# Patient Record
Sex: Female | Born: 1960 | Race: White | Hispanic: No | Marital: Married | Smoking: Former smoker
Health system: Southern US, Community
[De-identification: ages and names within clinical notes are randomized; demographics above are authoritative.]

## PROBLEM LIST (undated history)

## (undated) DIAGNOSIS — M51369 Other intervertebral disc degeneration, lumbar region without mention of lumbar back pain or lower extremity pain: Secondary | ICD-10-CM

## (undated) DIAGNOSIS — F319 Bipolar disorder, unspecified: Secondary | ICD-10-CM

## (undated) DIAGNOSIS — E611 Iron deficiency: Secondary | ICD-10-CM

## (undated) DIAGNOSIS — D649 Anemia, unspecified: Secondary | ICD-10-CM

## (undated) DIAGNOSIS — M5136 Other intervertebral disc degeneration, lumbar region: Secondary | ICD-10-CM

## (undated) DIAGNOSIS — K219 Gastro-esophageal reflux disease without esophagitis: Secondary | ICD-10-CM

## (undated) DIAGNOSIS — E7800 Pure hypercholesterolemia, unspecified: Secondary | ICD-10-CM

## (undated) HISTORY — DX: Other intervertebral disc degeneration, lumbar region: M51.36

## (undated) HISTORY — PX: TONSILLECTOMY AND ADENOIDECTOMY: SUR1326

## (undated) HISTORY — PX: VAGINAL HYSTERECTOMY: SUR661

## (undated) HISTORY — DX: Bipolar disorder, unspecified: F31.9

## (undated) HISTORY — PX: APPENDECTOMY: SHX54

## (undated) HISTORY — DX: Other intervertebral disc degeneration, lumbar region without mention of lumbar back pain or lower extremity pain: M51.369

## (undated) HISTORY — PX: TOTAL ABDOMINAL HYSTERECTOMY: SHX209

## (undated) HISTORY — PX: CHOLECYSTECTOMY: SHX55

## (undated) HISTORY — PX: CATARACT EXTRACTION: SUR2

## (undated) HISTORY — PX: EYE SURGERY: SHX253

## (undated) HISTORY — PX: BUNIONECTOMY: SHX129

---

## 1998-07-22 ENCOUNTER — Ambulatory Visit (HOSPITAL_COMMUNITY): Admission: RE | Admit: 1998-07-22 | Discharge: 1998-07-22 | Payer: Self-pay | Admitting: Gastroenterology

## 1998-07-22 ENCOUNTER — Encounter: Payer: Self-pay | Admitting: Gastroenterology

## 1999-12-27 ENCOUNTER — Encounter: Payer: Self-pay | Admitting: Emergency Medicine

## 1999-12-27 ENCOUNTER — Emergency Department (HOSPITAL_COMMUNITY): Admission: EM | Admit: 1999-12-27 | Discharge: 1999-12-27 | Payer: Self-pay | Admitting: Emergency Medicine

## 2001-02-25 ENCOUNTER — Emergency Department (HOSPITAL_COMMUNITY): Admission: EM | Admit: 2001-02-25 | Discharge: 2001-02-25 | Payer: Self-pay | Admitting: Emergency Medicine

## 2002-03-30 ENCOUNTER — Ambulatory Visit (HOSPITAL_COMMUNITY): Admission: RE | Admit: 2002-03-30 | Discharge: 2002-03-30 | Payer: Self-pay | Admitting: Orthopedic Surgery

## 2002-03-30 ENCOUNTER — Encounter: Payer: Self-pay | Admitting: Orthopedic Surgery

## 2002-09-27 ENCOUNTER — Emergency Department (HOSPITAL_COMMUNITY): Admission: EM | Admit: 2002-09-27 | Discharge: 2002-09-27 | Payer: Self-pay | Admitting: Emergency Medicine

## 2002-09-27 ENCOUNTER — Encounter: Payer: Self-pay | Admitting: Emergency Medicine

## 2003-12-28 ENCOUNTER — Other Ambulatory Visit: Admission: RE | Admit: 2003-12-28 | Discharge: 2003-12-28 | Payer: Self-pay | Admitting: *Deleted

## 2010-02-24 ENCOUNTER — Emergency Department (HOSPITAL_COMMUNITY)
Admission: EM | Admit: 2010-02-24 | Discharge: 2010-02-25 | Payer: Self-pay | Source: Home / Self Care | Admitting: Emergency Medicine

## 2010-03-29 ENCOUNTER — Emergency Department (HOSPITAL_COMMUNITY): Admission: EM | Admit: 2010-03-29 | Discharge: 2010-03-29 | Payer: Self-pay | Admitting: Emergency Medicine

## 2010-11-15 LAB — URINALYSIS, ROUTINE W REFLEX MICROSCOPIC
Bilirubin Urine: NEGATIVE
Glucose, UA: NEGATIVE mg/dL
Hgb urine dipstick: NEGATIVE
Ketones, ur: NEGATIVE mg/dL
Nitrite: POSITIVE — AB
Protein, ur: NEGATIVE mg/dL
Specific Gravity, Urine: 1.012 (ref 1.005–1.030)
Urobilinogen, UA: 1 mg/dL (ref 0.0–1.0)
pH: 6.5 (ref 5.0–8.0)

## 2010-11-15 LAB — URINE CULTURE
Colony Count: >=100000
Culture  Setup Time: 201107311302

## 2010-11-15 LAB — URINE MICROSCOPIC-ADD ON

## 2010-11-16 LAB — CBC
Hemoglobin: 13.5 g/dL (ref 12.0–15.0)
MCH: 29.8 pg (ref 26.0–34.0)
MCV: 87.5 fL (ref 78.0–100.0)
RBC: 4.53 MIL/uL (ref 3.87–5.11)

## 2010-11-16 LAB — POCT I-STAT, CHEM 8
Creatinine, Ser: 1.1 mg/dL (ref 0.4–1.2)
Glucose, Bld: 110 mg/dL — ABNORMAL HIGH (ref 70–99)
Hemoglobin: 14.6 g/dL (ref 12.0–15.0)
TCO2: 22 mmol/L (ref 0–100)

## 2010-11-16 LAB — DIFFERENTIAL
Eosinophils Absolute: 0.2 10*3/uL (ref 0.0–0.7)
Eosinophils Relative: 2 % (ref 0–5)
Lymphs Abs: 3.4 10*3/uL (ref 0.7–4.0)
Monocytes Relative: 7 % (ref 3–12)

## 2010-11-16 LAB — URINALYSIS, ROUTINE W REFLEX MICROSCOPIC
Bilirubin Urine: NEGATIVE
Nitrite: POSITIVE — AB
Specific Gravity, Urine: 1.01 (ref 1.005–1.030)
pH: 5.5 (ref 5.0–8.0)

## 2010-11-16 LAB — URINE MICROSCOPIC-ADD ON

## 2010-11-16 LAB — URINE CULTURE: Colony Count: 15000

## 2011-01-16 NOTE — Op Note (Signed)
Katie Joyce, Katie Joyce                     ACCOUNT NO.:  [id number]   MEDICAL RECORD NO.:  [id number]                   PATIENT TYPE:  AMB   LOCATION:  DAY                                  FACILITY:  [facility]   PHYSICIAN:  James P. Aplington, M.D.            DATE OF BIRTH:  28-Nov-1960   DATE OF PROCEDURE:  03/30/2002  DATE OF DISCHARGE:                                 OPERATIVE REPORT   PREOPERATIVE DIAGNOSIS:  Internal derangement left knee with suspected torn  medial meniscus.   POSTOPERATIVE DIAGNOSIS:  Abrasion injury to superior anterior medial  meniscus with small partial tear of the surface midbody medial meniscus,  left knee.   OPERATION:  Left knee arthroscopy with debridement of medial meniscus.   SURGEON:  Illene Labrador. Aplington, M.D.   ASSISTANT:  Nurse.   ANESTHESIA:  General.   INDICATIONS FOR PROCEDURE:  She has had progressive pain in the inner aspect  of her left knee associated with popping for the last several months with  occasional giving away and swelling in the knee.  Plain x-rays have been  unremarkable.  MRI has demonstrated a grade 2 signal on the underneath  surface of the medial meniscus but no other real abnormalities.  Because of  the chronicity of her problem and the fact that she is unable to take  aspirin-type drugs, she is here today for diagnostic and probable  therapeutic arthroscopy.  It was explained to her preoperatively that an MRI  would not show things such as adhesions, impinging synovitis, etc.   DESCRIPTION OF PROCEDURE:  After satisfactory general anesthesia and a light  tourniquet was placed stabilizing the left knee it was prepped with Duraprep  and draped as a sterile field, superior needle saline inflow.  First through  an anterolateral portal the medial compartment was evaluated.  The anterior  joint was abnormal.  There was a good bit of thickened synovium which was  not only covering the medial meniscus but adherent to it in  some areas with  some roughening of the medial meniscus.  There also appeared to be a small  fleck of articular cartilage floating in the joint.  Her ACL was intact and  the medial femoral condyle and medial tibial plateau were normal.  With a  3.5 shaver I debrided out all of the synovium and shaved the anterior medial  meniscus.  I then probed posteriorly.  Externally the meniscus appeared to  be normal on the underneath surface in the midbody as described on the MRI.  There was a small defect adjacent to the synovium which I abraded down with  the 3.5 shaver and also small baskets.  Pre and post films were taken.  I  then looked at the medial gutter and suprapatellar areas.  No abnormalities  were noted.  There was no plica and no chondromalacia of the patella.  I  then reversed portals and through an anteromedial portal  I looked at the  lateral joint.  She had a modest amount of synovium which may have been the  impingement problem which was pictured and resected.  The remainder of the  joint looked normal other than some minimal wear of the weightbearing  surface of the lateral femoral condyle.  Look in the lateral gutters and the  patellar area; no other abnormalities were noted.  The knee  joint was then  irrigated until clear and all fluid positively removed.  The two entry  portals were closed with 4-0 nylon; 20 cc of 0.5% Marcaine and 4 mg of  morphine were then instilled through __________  which was removed and this  portal closed with 4-0 nylon as well.  Adaptic __________  dressing were  applied, the tourniquet was released.  She tolerated the procedure well and  was taken to the recovery room in satisfactory condition with no known  complications.                                               James P. Aplington, M.D.    JPA/MEDQ  D:  03/30/2002  T:  04/05/2002  Job:  [id number]

## 2015-11-19 ENCOUNTER — Ambulatory Visit: Payer: Self-pay | Admitting: *Deleted

## 2016-02-24 DIAGNOSIS — F3110 Bipolar disorder, current episode manic without psychotic features, unspecified: Secondary | ICD-10-CM | POA: Diagnosis not present

## 2016-02-28 ENCOUNTER — Ambulatory Visit (INDEPENDENT_AMBULATORY_CARE_PROVIDER_SITE_OTHER): Payer: BLUE CROSS/BLUE SHIELD | Admitting: Pediatrics

## 2016-02-28 ENCOUNTER — Encounter: Payer: Self-pay | Admitting: Pediatrics

## 2016-02-28 VITALS — BP 96/62 | HR 88 | Temp 97.6°F | Ht 64.0 in | Wt 225.8 lb

## 2016-02-28 DIAGNOSIS — Z87891 Personal history of nicotine dependence: Secondary | ICD-10-CM | POA: Diagnosis not present

## 2016-02-28 DIAGNOSIS — K219 Gastro-esophageal reflux disease without esophagitis: Secondary | ICD-10-CM

## 2016-02-28 DIAGNOSIS — Z1159 Encounter for screening for other viral diseases: Secondary | ICD-10-CM

## 2016-02-28 DIAGNOSIS — F319 Bipolar disorder, unspecified: Secondary | ICD-10-CM | POA: Diagnosis not present

## 2016-02-28 DIAGNOSIS — E785 Hyperlipidemia, unspecified: Secondary | ICD-10-CM

## 2016-02-28 DIAGNOSIS — E8940 Asymptomatic postprocedural ovarian failure: Secondary | ICD-10-CM

## 2016-02-28 DIAGNOSIS — E669 Obesity, unspecified: Secondary | ICD-10-CM

## 2016-02-28 DIAGNOSIS — E66812 Obesity, class 2: Secondary | ICD-10-CM

## 2016-02-28 LAB — BAYER DCA HB A1C WAIVED: HB A1C: 5.6 % (ref <7.0)

## 2016-02-28 MED ORDER — ESTROGENS CONJUGATED 0.9 MG PO TABS: 0.9000 mg | ORAL_TABLET | Freq: Every day | ORAL | Status: DC

## 2016-02-28 MED ORDER — OMEPRAZOLE 20 MG PO CPDR: 20.0000 mg | DELAYED_RELEASE_CAPSULE | Freq: Every day | ORAL | Status: DC

## 2016-02-28 MED ORDER — EPINEPHRINE 0.3 MG/0.3ML IJ SOAJ: 0.3000 mg | Freq: Once | INTRAMUSCULAR | Status: DC

## 2016-02-28 MED ORDER — FENOFIBRATE 145 MG PO TABS: 145.0000 mg | ORAL_TABLET | Freq: Every day | ORAL | Status: DC

## 2016-02-28 NOTE — Progress Notes (Signed)
Subjective:    Patient ID: Katie Joyce, female    DOB: [birth year], 55 y.o.   MRN: [id number]  CC: New Patient (Initial Visit) and med prob f/u  HPI: Katie Joyce is a 55 y.o. female presenting for New Patient (Initial Visit)  Menopause: on premarin, has been since hysterectomy 18 yrs ago.  Tobacco abuse: in past quit smoking, but then started again three years ago  BMI elevated: stopped soda recently, trying to walk more. Has back pain  Hypertriglyceridemia: allergic to medicines other than fenofibrate that she has been tried on in the past  Bipolar: followed by Sharlotte Alamo, on topiramte and xanax  Migraines: topiramate helps a lot, has been a long time since she has had   Pap smear: s/p hysterectomy Colonoscopy: done in 05/2012, due 05/09/2017, h/o of polyps but none on most recent. Done at [facility] but not able to pull up report in Care Everywhere. Mammogram: due  Depression screen PHQ 2/9 02/28/2016  Decreased Interest 0  Down, Depressed, Hopeless 0  PHQ - 2 Score 0   Past Medical History  Diagnosis Date  . Bipolar 1 disorder ([place])   . Degenerative disc disease, lumbar     Social History   Social History  . Marital Status: Married    Spouse Name: N/A  . Number of Children: N/A  . Years of Education: N/A   Occupational History  . Not on file.   Social History Main Topics  . Smoking status: Current Every Day Smoker -- 1.00 packs/day    Types: Cigarettes  . Smokeless tobacco: Not on file  . Alcohol Use: No  . Drug Use: No  . Sexual Activity: Yes   Other Topics Concern  . Not on file   Social History Narrative  . No narrative on file   FH: grandparents heart disease Mom-lung cancer Dad blood clot   ROS: All systems negative other than what is in HPI  History  Smoking status  . Current Every Day Smoker -- 1.00 packs/day  . Types: Cigarettes  Smokeless tobacco  . Not on file       Objective:    BP 96/62 mmHg  Pulse 88  Temp(Src)  97.6 F (36.4 C) (Oral)  Ht _0  (1.626 m)  Wt 225 lb 12.8 oz (102.422 kg)  BMI 38.74 kg/m2  Wt Readings from Last 3 Encounters:  02/28/16 225 lb 12.8 oz (102.422 kg)     Gen: NAD, alert, cooperative with exam, NCAT EYES: EOMI, no scleral injection or icterus ENT:   OP without erythema LYMPH: no cervical LAD CV: NRRR, normal S1/S2, no murmur, distal pulses 2+ b/l Resp: CTABL, no wheezes, normal WOB Abd: +BS, soft, NTND. no guarding or organomegaly Ext: No edema, warm Neuro: Alert and oriented, strength equal b/l UE and LE, coordination grossly normal MSK: normal muscle bulk     Assessment & Plan:    [place] was seen today for new patient (initial visit) and multiple med problem follow up.  Diagnoses and all orders for this visit:  Gastroesophageal reflux disease, esophagitis presence not specified Well controlled on PPI  Bipolar I disorder ([place]) Followed by Sharlotte Alamo, well controlled  History of tobacco use Current use. Discussed risk of being on estrogen replacement and smoking use. Will continue to decrease estrogen dose.  HLD (hyperlipidemia) -     fenofibrate (TRICOR) 145 MG tablet; Take 1 tablet (145 mg total) by mouth daily.  Class 2 obesity -  CMP14+EGFR -     Bayer DCA Hb A1c Waived -     Lipid panel -     TSH Discussed lifestyle and nutrition changes  Need for hepatitis C screening test -     Hepatitis C antibody -     omeprazole (PRILOSEC) 20 MG capsule; Take 1 capsule (20 mg total) by mouth daily.  Postsurgical ovarian failure -     estrogens, conjugated, (PREMARIN) 0.9 MG tablet; Take 1 tablet (0.9 mg total) by mouth daily. Need to wean off estrogen, also currently smoking. Plan to decrease dose next visit.  Other orders Multiple environmental, food allergies -     EPINEPHrine (EPIPEN 2-PAK) 0.3 mg/0.3 mL IJ SOAJ injection; Inject 0.3 mLs (0.3 mg total) into the muscle once.     Follow up plan: Return in about 6 months (around  08/29/2016).  Assunta Found, MD [facility] 02/28/2016, 11:58 AM

## 2016-02-29 LAB — CMP14+EGFR
ALBUMIN: 4.1 g/dL (ref 3.5–5.5)
ALK PHOS: 49 IU/L (ref 39–117)
ALT: 21 IU/L (ref 0–32)
AST: 30 IU/L (ref 0–40)
Albumin/Globulin Ratio: 1.4 (ref 1.2–2.2)
BILIRUBIN TOTAL: 0.3 mg/dL (ref 0.0–1.2)
BUN/Creatinine Ratio: 17 (ref 9–23)
BUN: 18 mg/dL (ref 6–24)
CHLORIDE: 103 mmol/L (ref 96–106)
CO2: 20 mmol/L (ref 18–29)
CREATININE: 1.08 mg/dL — AB (ref 0.57–1.00)
Calcium: 9.7 mg/dL (ref 8.7–10.2)
GFR calc Af Amer: 68 mL/min/{1.73_m2} (ref >59)
GFR calc non Af Amer: 59 mL/min/{1.73_m2} — ABNORMAL LOW (ref >59)
GLUCOSE: 95 mg/dL (ref 65–99)
Globulin, Total: 2.9 g/dL (ref 1.5–4.5)
Potassium: 4.6 mmol/L (ref 3.5–5.2)
Sodium: 141 mmol/L (ref 134–144)
Total Protein: 7 g/dL (ref 6.0–8.5)

## 2016-02-29 LAB — TSH: TSH: 0.669 u[IU]/mL (ref 0.450–4.500)

## 2016-02-29 LAB — LIPID PANEL
CHOLESTEROL TOTAL: 187 mg/dL (ref 100–199)
Chol/HDL Ratio: 6.9 ratio units — ABNORMAL HIGH (ref 0.0–4.4)
HDL: 27 mg/dL — ABNORMAL LOW (ref >39)
LDL CALC: 89 mg/dL (ref 0–99)
TRIGLYCERIDES: 356 mg/dL — AB (ref 0–149)
VLDL CHOLESTEROL CAL: 71 mg/dL — AB (ref 5–40)

## 2016-02-29 LAB — HEPATITIS C ANTIBODY

## 2016-03-05 ENCOUNTER — Telehealth: Payer: Self-pay | Admitting: Pediatrics

## 2016-03-06 NOTE — Telephone Encounter (Signed)
Please review lab results for patient and advise

## 2016-05-25 DIAGNOSIS — F3110 Bipolar disorder, current episode manic without psychotic features, unspecified: Secondary | ICD-10-CM | POA: Diagnosis not present

## 2016-06-30 DIAGNOSIS — F3110 Bipolar disorder, current episode manic without psychotic features, unspecified: Secondary | ICD-10-CM | POA: Diagnosis not present

## 2016-07-03 DIAGNOSIS — R197 Diarrhea, unspecified: Secondary | ICD-10-CM | POA: Diagnosis not present

## 2016-07-03 DIAGNOSIS — R112 Nausea with vomiting, unspecified: Secondary | ICD-10-CM | POA: Diagnosis not present

## 2016-07-20 ENCOUNTER — Encounter: Payer: BLUE CROSS/BLUE SHIELD | Admitting: *Deleted

## 2016-08-24 ENCOUNTER — Other Ambulatory Visit: Payer: Self-pay | Admitting: Pediatrics

## 2016-08-24 DIAGNOSIS — Z1159 Encounter for screening for other viral diseases: Secondary | ICD-10-CM

## 2016-09-07 DIAGNOSIS — F3110 Bipolar disorder, current episode manic without psychotic features, unspecified: Secondary | ICD-10-CM | POA: Diagnosis not present

## 2016-09-21 ENCOUNTER — Other Ambulatory Visit: Payer: Self-pay | Admitting: Pediatrics

## 2016-09-21 DIAGNOSIS — E8940 Asymptomatic postprocedural ovarian failure: Secondary | ICD-10-CM

## 2016-10-07 ENCOUNTER — Other Ambulatory Visit: Payer: Self-pay | Admitting: Pediatrics

## 2016-10-07 DIAGNOSIS — E8940 Asymptomatic postprocedural ovarian failure: Secondary | ICD-10-CM

## 2016-10-16 ENCOUNTER — Other Ambulatory Visit: Payer: Self-pay | Admitting: Pediatrics

## 2016-10-16 DIAGNOSIS — Z1159 Encounter for screening for other viral diseases: Secondary | ICD-10-CM

## 2016-10-19 ENCOUNTER — Other Ambulatory Visit: Payer: Self-pay | Admitting: Pediatrics

## 2016-10-19 DIAGNOSIS — E785 Hyperlipidemia, unspecified: Secondary | ICD-10-CM

## 2016-11-23 ENCOUNTER — Other Ambulatory Visit: Payer: Self-pay | Admitting: Pediatrics

## 2016-11-23 DIAGNOSIS — E8940 Asymptomatic postprocedural ovarian failure: Secondary | ICD-10-CM

## 2016-11-23 DIAGNOSIS — Z1159 Encounter for screening for other viral diseases: Secondary | ICD-10-CM

## 2016-11-23 DIAGNOSIS — E785 Hyperlipidemia, unspecified: Secondary | ICD-10-CM

## 2016-11-25 ENCOUNTER — Other Ambulatory Visit: Payer: Self-pay | Admitting: Pediatrics

## 2016-11-25 DIAGNOSIS — E785 Hyperlipidemia, unspecified: Secondary | ICD-10-CM

## 2016-11-25 DIAGNOSIS — E8940 Asymptomatic postprocedural ovarian failure: Secondary | ICD-10-CM

## 2016-11-25 DIAGNOSIS — Z1159 Encounter for screening for other viral diseases: Secondary | ICD-10-CM

## 2016-11-27 ENCOUNTER — Other Ambulatory Visit: Payer: Self-pay | Admitting: Pediatrics

## 2016-11-27 DIAGNOSIS — Z1159 Encounter for screening for other viral diseases: Secondary | ICD-10-CM

## 2016-11-27 DIAGNOSIS — E785 Hyperlipidemia, unspecified: Secondary | ICD-10-CM

## 2016-11-27 DIAGNOSIS — E8940 Asymptomatic postprocedural ovarian failure: Secondary | ICD-10-CM

## 2016-11-30 DIAGNOSIS — F3110 Bipolar disorder, current episode manic without psychotic features, unspecified: Secondary | ICD-10-CM | POA: Diagnosis not present

## 2016-12-02 ENCOUNTER — Other Ambulatory Visit: Payer: Self-pay | Admitting: Pediatrics

## 2016-12-02 DIAGNOSIS — E8940 Asymptomatic postprocedural ovarian failure: Secondary | ICD-10-CM

## 2016-12-02 DIAGNOSIS — E785 Hyperlipidemia, unspecified: Secondary | ICD-10-CM

## 2016-12-02 DIAGNOSIS — Z1159 Encounter for screening for other viral diseases: Secondary | ICD-10-CM

## 2016-12-03 ENCOUNTER — Telehealth: Payer: Self-pay | Admitting: Pediatrics

## 2016-12-03 DIAGNOSIS — Z1159 Encounter for screening for other viral diseases: Secondary | ICD-10-CM

## 2016-12-03 DIAGNOSIS — E8940 Asymptomatic postprocedural ovarian failure: Secondary | ICD-10-CM

## 2016-12-03 DIAGNOSIS — E785 Hyperlipidemia, unspecified: Secondary | ICD-10-CM

## 2016-12-03 MED ORDER — OMEPRAZOLE 20 MG PO CPDR: 20.0000 mg | DELAYED_RELEASE_CAPSULE | Freq: Every day | ORAL | 0 refills | Status: DC

## 2016-12-03 MED ORDER — ESTROGENS CONJUGATED 0.9 MG PO TABS: 0.9000 mg | ORAL_TABLET | Freq: Every day | ORAL | 0 refills | Status: DC

## 2016-12-03 MED ORDER — FENOFIBRATE 145 MG PO TABS: 145.0000 mg | ORAL_TABLET | Freq: Every day | ORAL | 0 refills | Status: DC

## 2016-12-03 NOTE — Telephone Encounter (Signed)
Patient advised that she needs to be seen. Appointment scheduled for Tuesday. Patient wants to know if you can fill until appointment?

## 2016-12-04 ENCOUNTER — Telehealth: Payer: Self-pay | Admitting: Pediatrics

## 2016-12-04 NOTE — Telephone Encounter (Signed)
Per last prescription patient is to take premarin once daily.

## 2016-12-04 NOTE — Telephone Encounter (Signed)
Patient aware that medication has been sent to the pharmacy 

## 2016-12-08 ENCOUNTER — Encounter: Payer: Self-pay | Admitting: Pediatrics

## 2016-12-08 ENCOUNTER — Ambulatory Visit (INDEPENDENT_AMBULATORY_CARE_PROVIDER_SITE_OTHER): Payer: BLUE CROSS/BLUE SHIELD | Admitting: Pediatrics

## 2016-12-08 VITALS — BP 130/85 | HR 100 | Temp 97.5°F | Ht 64.0 in | Wt 217.4 lb

## 2016-12-08 DIAGNOSIS — M79671 Pain in right foot: Secondary | ICD-10-CM

## 2016-12-08 DIAGNOSIS — K219 Gastro-esophageal reflux disease without esophagitis: Secondary | ICD-10-CM | POA: Diagnosis not present

## 2016-12-08 DIAGNOSIS — E8940 Asymptomatic postprocedural ovarian failure: Secondary | ICD-10-CM

## 2016-12-08 DIAGNOSIS — E785 Hyperlipidemia, unspecified: Secondary | ICD-10-CM | POA: Diagnosis not present

## 2016-12-08 MED ORDER — FENOFIBRATE 145 MG PO TABS: 145.0000 mg | ORAL_TABLET | Freq: Every day | ORAL | 1 refills | Status: DC

## 2016-12-08 MED ORDER — ESTROGENS CONJUGATED 0.9 MG PO TABS: 0.9000 mg | ORAL_TABLET | Freq: Every day | ORAL | 1 refills | Status: DC

## 2016-12-08 MED ORDER — OMEPRAZOLE 20 MG PO CPDR: 20.0000 mg | DELAYED_RELEASE_CAPSULE | Freq: Every day | ORAL | 1 refills | Status: DC

## 2016-12-08 NOTE — Progress Notes (Signed)
  Subjective:   Patient ID: Katie Joyce, female    DOB: 04-Jul-1961, 56 y.o.   MRN: [id number] CC: Follow-up and Toe Pain (Right 2 middle toes)  HPI: [name] is a 56 y.o. female presenting for Follow-up and Toe Pain (Right 2 middle toes)  Tobacco use: ongoing Not interested in cessation  GERD: notices when she misses a dose Takes regularly  Triglyceridemia: fenofibrate no reaction, had red rash, flushing with statin  3rd and 4th toe on R foot with worsening tingling Thought it was the shoes at first, ongoing with all shoes now, also with barefeet Any pressure on the bottom of her foot sends the tingling to her toes  Surgical menopause: takes estrogen daily   Relevant past medical, surgical, family and social history reviewed. Allergies and medications reviewed and updated. History  Smoking Status  . Current Every Day Smoker  . Packs/day: 1.00  . Types: Cigarettes  Smokeless Tobacco  . Never Used   ROS: Per HPI   Objective:    BP 130/85   Pulse 100   Temp 97.5 F (36.4 C) (Oral)   Ht  (1.626 m)   Wt 217 lb 6.4 oz (98.6 kg)   BMI 37.32 kg/m   Wt Readings from Last 3 Encounters:  12/08/16 217 lb 6.4 oz (98.6 kg)  02/28/16 225 lb 12.8 oz (102.4 kg)    Gen: NAD, alert, cooperative with exam, NCAT EYES: EOMI, no conjunctival injection, or no icterus ENT:  TMs pearly gray b/l, OP without erythema LYMPH: no cervical LAD CV: NRRR, normal S1/S2, no murmur, distal pulses 2+ b/l Resp: CTABL, no wheezes, normal WOB Abd: +BS, soft, NTND. no guarding or organomegaly Ext: No edema, warm Neuro: Alert and oriented, strength equal b/l UE and LE, coordination grossly normal MSK: pain and tingling in 3rd and 4th toe with palpation between toes, dorsum and plantar surfaces  Assessment & Plan:  [name] was seen today for follow-up and toe pain.  Diagnoses and all orders for this visit:  Gastroesophageal reflux disease, esophagitis presence not  specified Symptoms controlled with below, cont -     omeprazole (PRILOSEC) 20 MG capsule; Take 1 capsule (20 mg total) by mouth daily.  Postsurgical ovarian failure Will continue for now Pt not interested in weaning estrogen Discussed risk of stroke/MI with tobacco use/estrogen -     estrogens, conjugated, (PREMARIN) 0.9 MG tablet; Take 1 tablet (0.9 mg total) by mouth daily.  Hyperlipidemia, unspecified hyperlipidemia type Intolerant of statin Cont below -     fenofibrate (TRICOR) 145 MG tablet; Take 1 tablet (145 mg total) by mouth daily.  Right foot pain Possible mortons neuroma, refer to podiatry -     Ambulatory referral to Podiatry   Follow up plan: Return in about 6 months (around 06/09/2017). Rex Kras, MD [facility]

## 2016-12-15 DIAGNOSIS — M79671 Pain in right foot: Secondary | ICD-10-CM | POA: Diagnosis not present

## 2016-12-15 DIAGNOSIS — M7741 Metatarsalgia, right foot: Secondary | ICD-10-CM | POA: Diagnosis not present

## 2017-01-05 DIAGNOSIS — M79671 Pain in right foot: Secondary | ICD-10-CM | POA: Diagnosis not present

## 2017-01-05 DIAGNOSIS — M7741 Metatarsalgia, right foot: Secondary | ICD-10-CM | POA: Diagnosis not present

## 2017-02-02 DIAGNOSIS — H1132 Conjunctival hemorrhage, left eye: Secondary | ICD-10-CM | POA: Diagnosis not present

## 2017-02-02 DIAGNOSIS — Z961 Presence of intraocular lens: Secondary | ICD-10-CM | POA: Diagnosis not present

## 2017-02-02 DIAGNOSIS — H25812 Combined forms of age-related cataract, left eye: Secondary | ICD-10-CM | POA: Diagnosis not present

## 2017-03-01 DIAGNOSIS — F3110 Bipolar disorder, current episode manic without psychotic features, unspecified: Secondary | ICD-10-CM | POA: Diagnosis not present

## 2017-03-08 ENCOUNTER — Encounter: Payer: Self-pay | Admitting: Pediatrics

## 2017-03-08 ENCOUNTER — Ambulatory Visit (INDEPENDENT_AMBULATORY_CARE_PROVIDER_SITE_OTHER): Payer: BLUE CROSS/BLUE SHIELD | Admitting: Pediatrics

## 2017-03-08 VITALS — BP 136/90 | HR 90 | Temp 97.5°F | Ht 64.0 in | Wt 220.6 lb

## 2017-03-08 DIAGNOSIS — R1084 Generalized abdominal pain: Secondary | ICD-10-CM

## 2017-03-08 NOTE — Progress Notes (Signed)
  Subjective:   Patient ID: Katie Joyce, female    DOB: 03-26-1961, 56 y.o.   MRN: [id number] CC: Abdominal Pain (lower abd pain in middle of abd,  bowel movements painful, started Thursday, no diarrhea or hard stools just painful cramps, not cramping anymore just pain)  HPI: Katie Joyce is a 57 y.o. female presenting for Abdominal Pain (lower abd pain in middle of abd,  bowel movements painful, started Thursday, no diarrhea or hard stools just painful cramps, not cramping anymore just pain)  No burning with urination Has pressure in lower abdomen, gets worse with straining to have stool Appetite has been down No fevers Never had this pain before Comes and goes, worse in certain positions, worse when sitting on toilet  Relevant past medical, surgical, family and social history reviewed. Allergies and medications reviewed and updated. History  Smoking Status  . Current Every Day Smoker  . Packs/day: 1.00  . Types: Cigarettes  Smokeless Tobacco  . Never Used   ROS: Per HPI   Objective:    BP 136/90   Pulse 90   Temp (!) 97.5 F (36.4 C) (Oral)   Ht '5\' 4"'$  (1.626 m)   Wt 220 lb 9.6 oz (100.1 kg)   BMI 37.87 kg/m   Wt Readings from Last 3 Encounters:  03/08/17 220 lb 9.6 oz (100.1 kg)  12/08/16 217 lb 6.4 oz (98.6 kg)  02/28/16 225 lb 12.8 oz (102.4 kg)    Gen: NAD, alert, cooperative with exam, NCAT EYES: EOMI, no conjunctival injection, or no icterus ENT:  TMs pearly gray b/l, OP without erythema LYMPH: no cervical LAD CV: NRRR, normal S1/S2, no murmur, distal pulses 2+ b/l Resp: CTABL, no wheezes, normal WOB Abd: +BS, soft, mildly TTP throughout, no rebound, ND. no guarding or organomegaly Ext: No edema, warm Neuro: Alert and oriented  Assessment & Plan:  [name] was seen today for abdominal pain.  Diagnoses and all orders for this visit:  Generalized abdominal pain Some pain when straining to have stools Discussed constipation treatment Not able to get  xray as in after hours clinic, if pain not improving by tomorrow rtc for KUB Any fevers, worsening abd pain must be seen right away -     CMP14+EGFR -     CBC with Differential/Platelet -     DG Abd 1 View; Future   Follow up plan: Return in about 2 weeks (around 03/22/2017). Assunta Found, MD [facility]

## 2017-03-08 NOTE — Patient Instructions (Signed)
Labs today Come back when able to xray of belly  Take miralax to help with any hard stooling now No straining for passing stools

## 2017-03-10 ENCOUNTER — Other Ambulatory Visit (INDEPENDENT_AMBULATORY_CARE_PROVIDER_SITE_OTHER): Payer: BLUE CROSS/BLUE SHIELD

## 2017-03-10 DIAGNOSIS — R1084 Generalized abdominal pain: Secondary | ICD-10-CM

## 2017-03-12 ENCOUNTER — Other Ambulatory Visit: Payer: Self-pay | Admitting: Pediatrics

## 2017-03-12 DIAGNOSIS — R109 Unspecified abdominal pain: Secondary | ICD-10-CM

## 2017-03-12 LAB — CBC WITH DIFFERENTIAL/PLATELET
BASOS ABS: 0 10*3/uL (ref 0.0–0.2)
Basos: 0 %
EOS (ABSOLUTE): 0.2 10*3/uL (ref 0.0–0.4)
EOS: 2 %
Hematocrit: 37.1 % (ref 34.0–46.6)
Hemoglobin: 12.2 g/dL (ref 11.1–15.9)
IMMATURE GRANS (ABS): 0.1 10*3/uL (ref 0.0–0.1)
Immature Granulocytes: 1 %
Lymphocytes Absolute: 3.8 10*3/uL — ABNORMAL HIGH (ref 0.7–3.1)
Lymphs: 32 %
MCH: 26.9 pg (ref 26.6–33.0)
MCHC: 32.9 g/dL (ref 31.5–35.7)
MCV: 82 fL (ref 79–97)
MONOCYTES: 6 %
Monocytes Absolute: 0.7 10*3/uL (ref 0.1–0.9)
NEUTROS ABS: 7.1 10*3/uL — AB (ref 1.4–7.0)
Neutrophils: 59 %
Platelets: 350 10*3/uL (ref 150–379)
RBC: 4.54 x10E6/uL (ref 3.77–5.28)
RDW: 14.9 % (ref 12.3–15.4)
WBC: 12 10*3/uL — ABNORMAL HIGH (ref 3.4–10.8)

## 2017-03-12 LAB — CMP14+EGFR
ALT: 12 IU/L (ref 0–32)
AST: 10 IU/L (ref 0–40)
Albumin/Globulin Ratio: 1.4 (ref 1.2–2.2)
Albumin: 4.2 g/dL (ref 3.5–5.5)
Alkaline Phosphatase: 58 IU/L (ref 39–117)
BILIRUBIN TOTAL: 0.3 mg/dL (ref 0.0–1.2)
BUN/Creatinine Ratio: 12 (ref 9–23)
BUN: 13 mg/dL (ref 6–24)
CALCIUM: 9.4 mg/dL (ref 8.7–10.2)
CHLORIDE: 103 mmol/L (ref 96–106)
CO2: 18 mmol/L — AB (ref 20–29)
CREATININE: 1.1 mg/dL — AB (ref 0.57–1.00)
GFR calc non Af Amer: 57 mL/min/{1.73_m2} — ABNORMAL LOW (ref >59)
GFR, EST AFRICAN AMERICAN: 66 mL/min/{1.73_m2} (ref >59)
GLUCOSE: 98 mg/dL (ref 65–99)
Globulin, Total: 2.9 g/dL (ref 1.5–4.5)
Potassium: 4.5 mmol/L (ref 3.5–5.2)
Sodium: 138 mmol/L (ref 134–144)
TOTAL PROTEIN: 7.1 g/dL (ref 6.0–8.5)

## 2017-03-15 ENCOUNTER — Ambulatory Visit (HOSPITAL_COMMUNITY)
Admission: RE | Admit: 2017-03-15 | Discharge: 2017-03-15 | Disposition: A | Discharge status: Home / Self Care | Payer: BLUE CROSS/BLUE SHIELD | Source: Ambulatory Visit | Attending: Pediatrics | Admitting: Pediatrics

## 2017-03-15 DIAGNOSIS — K5730 Diverticulosis of large intestine without perforation or abscess without bleeding: Secondary | ICD-10-CM | POA: Diagnosis not present

## 2017-03-15 DIAGNOSIS — R109 Unspecified abdominal pain: Secondary | ICD-10-CM | POA: Diagnosis not present

## 2017-03-15 DIAGNOSIS — K760 Fatty (change of) liver, not elsewhere classified: Secondary | ICD-10-CM | POA: Insufficient documentation

## 2017-03-15 MED ORDER — IOPAMIDOL (ISOVUE-300) INJECTION 61%
100.0000 mL | Freq: Once | INTRAVENOUS | Status: AC | PRN
  Administered 2017-03-15: 100 mL via INTRAVENOUS

## 2017-03-18 ENCOUNTER — Telehealth: Payer: Self-pay | Admitting: Pediatrics

## 2017-03-19 NOTE — Telephone Encounter (Signed)
Ct  Normal no acute or chronic problems found- she has an inflammed lymph node that they are calling reactive- nothing to do about that

## 2017-03-25 ENCOUNTER — Encounter: Payer: Self-pay | Admitting: Pediatrics

## 2017-03-25 ENCOUNTER — Ambulatory Visit (INDEPENDENT_AMBULATORY_CARE_PROVIDER_SITE_OTHER): Payer: BLUE CROSS/BLUE SHIELD | Admitting: Pediatrics

## 2017-03-25 VITALS — BP 134/81 | HR 94 | Temp 97.8°F | Ht 64.0 in | Wt 220.2 lb

## 2017-03-25 DIAGNOSIS — R1013 Epigastric pain: Secondary | ICD-10-CM | POA: Diagnosis not present

## 2017-03-25 DIAGNOSIS — R300 Dysuria: Secondary | ICD-10-CM

## 2017-03-25 NOTE — Progress Notes (Signed)
  Subjective:   Patient ID: Katie Joyce, female    DOB: [birth year], 56 y.o.   MRN: [id number] CC: Follow-up (2 week) abd pain HPI: Katie Joyce is a 56 y.o. female presenting for Follow-up (2 week)  Ongoing abd pain Eating makes epigastic pain worse Mostly bothered by pain in lower abd and all over Not one place Has had CT scan since last visit, no cause of abd pain  Has GERD Takes omeprazole daily, if she misses a dose has acid feeling in mouth  Decreased appetite Sometimes not finishing dinner because of nausea and epigastric pain Weight has been stable stooling daily  Relevant past medical, surgical, family and social history reviewed. Allergies and medications reviewed and updated. History  Smoking Status  . Current Every Day Smoker  . Packs/day: 1.00  . Types: Cigarettes  Smokeless Tobacco  . Never Used   ROS: Per HPI   Objective:    BP 134/81   Pulse 94   Temp 97.8 F (36.6 C) (Oral)   Ht 5\' 4"  (1.626 m)   Wt 220 lb 3.2 oz (99.9 kg)   BMI 37.80 kg/m   Wt Readings from Last 3 Encounters:  03/25/17 220 lb 3.2 oz (99.9 kg)  03/08/17 220 lb 9.6 oz (100.1 kg)  12/08/16 217 lb 6.4 oz (98.6 kg)    Gen: NAD, alert, cooperative with exam, NCAT EYES: EOMI, no conjunctival injection, or no icterus CV: NRRR, normal S1/S2, no murmur, distal pulses 2+ b/l Resp: CTABL, no wheezes, normal WOB Abd: +BS, soft, tender with palpation throughout, no guarding, no peritoneal signs Ext: No edema, warm Neuro: Alert and oriented  Assessment & Plan:  Katie Joyce was seen today for follow-up med problems.  Diagnoses and all orders for this visit:  Epigastric pain Cont GERD treatment Avoid spicy foods Refer to GI -     Ambulatory referral to Gastroenterology  Dysuria eval for UTI -     Urinalysis, Complete -     Urine Culture  Other orders -     Microscopic Examination   Follow up plan: 4 weeks Rex Krasarol Vincent, MD [facility]

## 2017-03-26 LAB — URINALYSIS, COMPLETE
BILIRUBIN UA: POSITIVE — AB
GLUCOSE, UA: NEGATIVE
Ketones, UA: NEGATIVE
Leukocytes, UA: NEGATIVE
NITRITE UA: POSITIVE — AB
PH UA: 5.5 (ref 5.0–7.5)
PROTEIN UA: NEGATIVE
RBC UA: NEGATIVE
Specific Gravity, UA: >1.03 — ABNORMAL HIGH (ref 1.005–1.030)
UUROB: 0.2 mg/dL (ref 0.2–1.0)

## 2017-03-26 LAB — MICROSCOPIC EXAMINATION
RBC, UA: NONE SEEN /hpf (ref 0–?)
WBC, UA: NONE SEEN /hpf (ref 0–?)

## 2017-03-26 LAB — URINE CULTURE

## 2017-04-02 ENCOUNTER — Encounter: Payer: Self-pay | Admitting: Internal Medicine

## 2017-05-20 ENCOUNTER — Ambulatory Visit: Payer: BLUE CROSS/BLUE SHIELD | Admitting: Gastroenterology

## 2017-06-01 DIAGNOSIS — F3110 Bipolar disorder, current episode manic without psychotic features, unspecified: Secondary | ICD-10-CM | POA: Diagnosis not present

## 2017-06-04 ENCOUNTER — Ambulatory Visit (INDEPENDENT_AMBULATORY_CARE_PROVIDER_SITE_OTHER): Payer: BLUE CROSS/BLUE SHIELD | Admitting: Family Medicine

## 2017-06-04 ENCOUNTER — Encounter: Payer: Self-pay | Admitting: Family Medicine

## 2017-06-04 VITALS — BP 127/78 | HR 87 | Temp 97.2°F | Ht 64.0 in | Wt 222.0 lb

## 2017-06-04 DIAGNOSIS — R300 Dysuria: Secondary | ICD-10-CM

## 2017-06-04 DIAGNOSIS — R252 Cramp and spasm: Secondary | ICD-10-CM | POA: Diagnosis not present

## 2017-06-04 LAB — URINALYSIS
Bilirubin, UA: NEGATIVE
Glucose, UA: NEGATIVE
Ketones, UA: NEGATIVE
Leukocytes, UA: NEGATIVE
Nitrite, UA: POSITIVE — AB
Protein, UA: NEGATIVE
RBC, UA: NEGATIVE
Specific Gravity, UA: 1.015 (ref 1.005–1.030)
Urobilinogen, Ur: 0.2 mg/dL (ref 0.2–1.0)
pH, UA: 6.5 (ref 5.0–7.5)

## 2017-06-04 MED ORDER — DOXYCYCLINE HYCLATE 100 MG PO TABS: 100.0000 mg | ORAL_TABLET | Freq: Two times a day (BID) | ORAL | 0 refills | Status: DC

## 2017-06-04 MED ORDER — CYCLOBENZAPRINE HCL 10 MG PO TABS: 10.0000 mg | ORAL_TABLET | Freq: Every day | ORAL | 0 refills | Status: DC

## 2017-06-04 NOTE — Progress Notes (Signed)
Subjective:  Patient ID: Katie Joyce, female    DOB: [birth year]  Age: 56 y.o. MRN: [id number]  CC: Urinary Tract Infection (symptoms off and on) and leg cramps (always at night - very bad last night )   HPI Katie Joyce presents for burning with urination and frequency for several weeks. Denies fever . No flank pain. No nausea, vomiting. She used Azo and symptoms resolved, but recurred a few days later. She repeated this cycle several times until the last few days has developed moderately severe leg cramps in the anterior leg and calf bilaterally.    Depression screen [facility] 2/9 06/04/2017 03/25/2017 03/08/2017  Decreased Interest 0 0 0  Down, Depressed, Hopeless 0 0 0  PHQ - 2 Score 0 0 0    History Katie Joyce has a past medical history of Bipolar 1 disorder ([place]) and Degenerative disc disease, lumbar.   She has a past surgical history that includes Tonsillectomy and adenoidectomy; Vaginal hysterectomy; Appendectomy; Cholecystectomy; and Eye surgery.   Her family history is not on file.She reports that she has been smoking Cigarettes.  She has been smoking about 1.00 pack per day. She has never used smokeless tobacco. She reports that she does not drink alcohol or use drugs.    ROS Review of Systems  Constitutional: Negative for chills, diaphoresis and fever.  HENT: Negative for congestion.   Eyes: Negative for visual disturbance.  Respiratory: Negative for cough and shortness of breath.   Cardiovascular: Negative for chest pain and palpitations.  Gastrointestinal: Negative for constipation, diarrhea and nausea.  Genitourinary: Positive for dysuria, frequency and urgency. Negative for decreased urine volume, flank pain, hematuria, menstrual problem and pelvic pain.  Musculoskeletal: Negative for arthralgias and joint swelling.  Skin: Negative for rash.  Neurological: Negative for dizziness and numbness.    Objective:  BP 127/78 (BP Location: Left Arm)   Pulse 87   Temp (!)  97.2 F (36.2 C) (Oral)   Ht '5\' 4"'  (1.626 m)   Wt 222 lb (100.7 kg)   BMI 38.11 kg/m   BP Readings from Last 3 Encounters:  06/04/17 127/78  03/25/17 134/81  03/08/17 136/90    Wt Readings from Last 3 Encounters:  06/04/17 222 lb (100.7 kg)  03/25/17 220 lb 3.2 oz (99.9 kg)  03/08/17 220 lb 9.6 oz (100.1 kg)     Physical Exam  Constitutional: She is oriented to person, place, and time. She appears well-developed and well-nourished.  HENT:  Head: Normocephalic and atraumatic.  Cardiovascular: Normal rate and regular rhythm.   No murmur heard. Pulmonary/Chest: Effort normal and breath sounds normal.  Abdominal: Soft. Bowel sounds are normal. She exhibits no mass. There is no tenderness. There is no rebound and no guarding.  Musculoskeletal: She exhibits no tenderness.  Neurological: She is alert and oriented to person, place, and time.  Skin: Skin is warm and dry.  Psychiatric: She has a normal mood and affect. Her behavior is normal.      Assessment & Plan:   Katie Joyce was seen today for urinary tract infection and leg cramps.  Diagnoses and all orders for this visit:  Dysuria -     Urinalysis -     Urine Culture -     CBC with Differential/Platelet -     CMP14+EGFR  Leg cramps -     CBC with Differential/Platelet -     CMP14+EGFR -     Magnesium -     Phosphorus  Other orders -  doxycycline (VIBRA-TABS) 100 MG tablet; Take 1 tablet (100 mg total) by mouth 2 (two) times daily. 1 po bid -     cyclobenzaprine (FLEXERIL) 10 MG tablet; Take 1 tablet (10 mg total) by mouth at bedtime.       I have discontinued Ms. Leathers's fluticasone. I am also having her start on doxycycline and cyclobenzaprine. Additionally, I am having her maintain her Vitamin D3, Flax Seed Oil, topiramate, ALPRAZolam, EPINEPHrine, lamoTRIgine, omeprazole, estrogens (conjugated), and fenofibrate.  Allergies as of 06/04/2017      Reactions   Aspirin Anaphylaxis, Swelling   Bee Venom  Anaphylaxis   Coconut Oil Anaphylaxis   Fish Allergy Anaphylaxis   Niacin And Related Rash   Increased bp   Oatmeal Anaphylaxis   Peanut-containing Drug Products Anaphylaxis   Penicillins Anaphylaxis   Shellfish Allergy Anaphylaxis   Clarithromycin Nausea And Vomiting   rash   Lac Bovis Rash   Nitrofurantoin Rash   Sunscreens Rash   Calcium-containing Compounds    Rash body, mouth ulcers   Eggs Or Egg-derived Products Diarrhea   Other Diarrhea   Melon - bloating   Pork Allergy Diarrhea   Prunus Persica Diarrhea   Sulfa Antibiotics Rash      Medication List       Accurate as of 06/04/17  8:44 PM. Always use your most recent med list.          ALPRAZolam 0.25 MG tablet Commonly known as:  XANAX   cyclobenzaprine 10 MG tablet Commonly known as:  FLEXERIL Take 1 tablet (10 mg total) by mouth at bedtime.   doxycycline 100 MG tablet Commonly known as:  VIBRA-TABS Take 1 tablet (100 mg total) by mouth 2 (two) times daily. 1 po bid   EPINEPHrine 0.3 mg/0.3 mL Soaj injection Commonly known as:  EPIPEN 2-PAK Inject 0.3 mLs (0.3 mg total) into the muscle once.   estrogens (conjugated) 0.9 MG tablet Commonly known as:  PREMARIN Take 1 tablet (0.9 mg total) by mouth daily.   fenofibrate 145 MG tablet Commonly known as:  TRICOR Take 1 tablet (145 mg total) by mouth daily.   Flax Seed Oil 1000 MG Caps Take by mouth.   lamoTRIgine 100 MG tablet Commonly known as:  LAMICTAL   omeprazole 20 MG capsule Commonly known as:  PRILOSEC Take 1 capsule (20 mg total) by mouth daily.   topiramate 100 MG tablet Commonly known as:  TOPAMAX Take by mouth.   Vitamin D3 5000 units Caps Take by mouth.        Follow-up: Return if symptoms worsen or fail to improve.  Claretta Fraise, M.D.

## 2017-06-05 DIAGNOSIS — R252 Cramp and spasm: Secondary | ICD-10-CM | POA: Diagnosis not present

## 2017-06-05 DIAGNOSIS — R300 Dysuria: Secondary | ICD-10-CM | POA: Diagnosis not present

## 2017-06-06 LAB — CBC WITH DIFFERENTIAL/PLATELET
BASOS: 0 %
Basophils Absolute: 0 10*3/uL (ref 0.0–0.2)
EOS (ABSOLUTE): 0.2 10*3/uL (ref 0.0–0.4)
Eos: 2 %
HEMATOCRIT: 35.9 % (ref 34.0–46.6)
Hemoglobin: 11.2 g/dL (ref 11.1–15.9)
IMMATURE GRANS (ABS): 0.1 10*3/uL (ref 0.0–0.1)
Immature Granulocytes: 1 %
LYMPHS: 40 %
Lymphocytes Absolute: 4.1 10*3/uL — ABNORMAL HIGH (ref 0.7–3.1)
MCH: 26.1 pg — AB (ref 26.6–33.0)
MCHC: 31.2 g/dL — ABNORMAL LOW (ref 31.5–35.7)
MCV: 84 fL (ref 79–97)
MONOS ABS: 0.6 10*3/uL (ref 0.1–0.9)
Monocytes: 6 %
NEUTROS ABS: 5.2 10*3/uL (ref 1.4–7.0)
Neutrophils: 51 %
PLATELETS: 394 10*3/uL — AB (ref 150–379)
RBC: 4.29 x10E6/uL (ref 3.77–5.28)
RDW: 14.4 % (ref 12.3–15.4)
WBC: 10.1 10*3/uL (ref 3.4–10.8)

## 2017-06-06 LAB — MAGNESIUM: Magnesium: 1.9 mg/dL (ref 1.6–2.3)

## 2017-06-06 LAB — CMP14+EGFR
ALBUMIN: 4.3 g/dL (ref 3.5–5.5)
ALT: 12 IU/L (ref 0–32)
AST: 14 IU/L (ref 0–40)
Albumin/Globulin Ratio: 1.9 (ref 1.2–2.2)
Alkaline Phosphatase: 53 IU/L (ref 39–117)
BUN / CREAT RATIO: 16 (ref 9–23)
BUN: 17 mg/dL (ref 6–24)
CALCIUM: 9.5 mg/dL (ref 8.7–10.2)
CHLORIDE: 102 mmol/L (ref 96–106)
CO2: 22 mmol/L (ref 20–29)
CREATININE: 1.05 mg/dL — AB (ref 0.57–1.00)
GFR, EST AFRICAN AMERICAN: 70 mL/min/{1.73_m2} (ref >59)
GFR, EST NON AFRICAN AMERICAN: 60 mL/min/{1.73_m2} (ref >59)
GLUCOSE: 89 mg/dL (ref 65–99)
Globulin, Total: 2.3 g/dL (ref 1.5–4.5)
Potassium: 4.9 mmol/L (ref 3.5–5.2)
Sodium: 139 mmol/L (ref 134–144)
TOTAL PROTEIN: 6.6 g/dL (ref 6.0–8.5)

## 2017-06-06 LAB — URINE CULTURE

## 2017-06-06 LAB — PHOSPHORUS: Phosphorus: 3.5 mg/dL (ref 2.5–4.5)

## 2017-06-11 LAB — SPECIMEN STATUS REPORT

## 2017-06-11 LAB — IRON AND TIBC
Iron Saturation: 10 % — ABNORMAL LOW (ref 15–55)
Iron: 45 ug/dL (ref 27–159)
Total Iron Binding Capacity: 469 ug/dL — ABNORMAL HIGH (ref 250–450)
UIBC: 424 ug/dL (ref 131–425)

## 2017-06-11 LAB — FERRITIN: Ferritin: 20 ng/mL (ref 15–150)

## 2017-06-21 ENCOUNTER — Encounter: Payer: Self-pay | Admitting: Physician Assistant

## 2017-06-21 ENCOUNTER — Ambulatory Visit (INDEPENDENT_AMBULATORY_CARE_PROVIDER_SITE_OTHER): Payer: BLUE CROSS/BLUE SHIELD | Admitting: Physician Assistant

## 2017-06-21 VITALS — BP 139/79 | HR 104 | Temp 98.0°F | Ht 64.0 in | Wt 221.8 lb

## 2017-06-21 DIAGNOSIS — N390 Urinary tract infection, site not specified: Secondary | ICD-10-CM

## 2017-06-21 DIAGNOSIS — Z9889 Other specified postprocedural states: Secondary | ICD-10-CM | POA: Diagnosis not present

## 2017-06-21 DIAGNOSIS — N8110 Cystocele, unspecified: Secondary | ICD-10-CM | POA: Diagnosis not present

## 2017-06-21 DIAGNOSIS — Z87448 Personal history of other diseases of urinary system: Secondary | ICD-10-CM

## 2017-06-21 DIAGNOSIS — R300 Dysuria: Secondary | ICD-10-CM | POA: Diagnosis not present

## 2017-06-21 LAB — URINALYSIS
BILIRUBIN UA: NEGATIVE
GLUCOSE, UA: NEGATIVE
KETONES UA: NEGATIVE
Nitrite, UA: NEGATIVE
PROTEIN UA: NEGATIVE
RBC UA: NEGATIVE
Urobilinogen, Ur: 0.2 mg/dL (ref 0.2–1.0)
pH, UA: 5 (ref 5.0–7.5)

## 2017-06-21 MED ORDER — DOXYCYCLINE HYCLATE 100 MG PO TABS: 100.0000 mg | ORAL_TABLET | Freq: Two times a day (BID) | ORAL | 1 refills | Status: DC

## 2017-06-21 NOTE — Patient Instructions (Signed)
In a few days you may receive a survey in the mail or online from [employer] regarding your visit with us today. Please take a moment to fill this out. Your feedback is very important to our whole office. It can help us better understand your needs as well as improve your experience and satisfaction. Thank you for taking your time to complete it. We care about you.  Zyir Gassert, PA-C  

## 2017-06-21 NOTE — Progress Notes (Signed)
BP 139/79   Pulse (!) 104   Temp 98 F (36.7 C) (Oral)   Ht 5\' 4"  (1.626 m)   Wt 221 lb 12.8 oz (100.6 kg)   BMI 38.07 kg/m    Subjective:    Patient ID: Katie Joyce, female    DOB: Jul 28, 1961, 56 y.o.   MRN: [id number]  HPI: Katie Joyce is a 56 y.o. female presenting on 06/21/2017 for Back Pain (low ); burning when urinates; and Urinary Tract Infection  Patient has had 5 or 6 UTIs this year. She wears a pad at all times due to her bladder being fallen and leaking.  About 22 years ago she had hysterectomy and bladder sling surgery.   This patient has had several days of dysuria, frequency and nocturia. There is also pain over the bladder in the suprapubic region, no back pain. Has leakage but unsure about hematuria.  Denies fever or chills. No pain in flank area.   Relevant past medical, surgical, family and social history reviewed and updated as indicated. Allergies and medications reviewed and updated.  Past Medical History:  Diagnosis Date  . Bipolar 1 disorder (HCC)   . Degenerative disc disease, lumbar     Past Surgical History:  Procedure Laterality Date  . APPENDECTOMY    . CHOLECYSTECTOMY    . EYE SURGERY    . TONSILLECTOMY AND ADENOIDECTOMY    . VAGINAL HYSTERECTOMY      Review of Systems  Constitutional: Negative.   HENT: Negative.   Eyes: Negative.   Respiratory: Negative.   Gastrointestinal: Negative.   Genitourinary: Positive for difficulty urinating, dysuria, frequency, urgency and vaginal pain. Negative for flank pain.    Allergies as of 06/21/2017      Reactions   Aspirin Anaphylaxis, Swelling   Bee Venom Anaphylaxis   Coconut Oil Anaphylaxis   Fish Allergy Anaphylaxis   Niacin And Related Rash   Increased bp   Oatmeal Anaphylaxis   Peanut-containing Drug Products Anaphylaxis   Penicillins Anaphylaxis   Shellfish Allergy Anaphylaxis   Clarithromycin Nausea And Vomiting   rash   Lac Bovis Rash   Nitrofurantoin Rash   Sunscreens  Rash   Calcium-containing Compounds    Rash body, mouth ulcers   Eggs Or Egg-derived Products Diarrhea   Other Diarrhea   Melon - bloating   Pork Allergy Diarrhea   Prunus Persica Diarrhea   Sulfa Antibiotics Rash      Medication List       Accurate as of 06/21/17  6:40 PM. Always use your most recent med list.          ALPRAZolam 0.25 MG tablet Commonly known as:  XANAX   cyclobenzaprine 10 MG tablet Commonly known as:  FLEXERIL Take 1 tablet (10 mg total) by mouth at bedtime.   doxycycline 100 MG tablet Commonly known as:  VIBRA-TABS Take 1 tablet (100 mg total) by mouth 2 (two) times daily. 1 po bid   EPINEPHrine 0.3 mg/0.3 mL Soaj injection Commonly known as:  EPIPEN 2-PAK Inject 0.3 mLs (0.3 mg total) into the muscle once.   estrogens (conjugated) 0.9 MG tablet Commonly known as:  PREMARIN Take 1 tablet (0.9 mg total) by mouth daily.   fenofibrate 145 MG tablet Commonly known as:  TRICOR Take 1 tablet (145 mg total) by mouth daily.   Flax Seed Oil 1000 MG Caps Take by mouth.   lamoTRIgine 100 MG tablet Commonly known as:  LAMICTAL   omeprazole 20 MG  capsule Commonly known as:  PRILOSEC Take 1 capsule (20 mg total) by mouth daily.   topiramate 100 MG tablet Commonly known as:  TOPAMAX Take by mouth.   Vitamin D3 5000 units Caps Take by mouth.          Objective:    BP 139/79   Pulse (!) 104   Temp 98 F (36.7 C) (Oral)   Ht 5\' 4"  (1.626 m)   Wt 221 lb 12.8 oz (100.6 kg)   BMI 38.07 kg/m   Allergies  Allergen Reactions  . Aspirin Anaphylaxis and Swelling  . Bee Venom Anaphylaxis  . Coconut Oil Anaphylaxis  . Fish Allergy Anaphylaxis  . Niacin And Related Rash    Increased bp  . Oatmeal Anaphylaxis  . Peanut-Containing Drug Products Anaphylaxis  . Penicillins Anaphylaxis  . Shellfish Allergy Anaphylaxis  . Clarithromycin Nausea And Vomiting    rash  . Lac Bovis Rash  . Nitrofurantoin Rash  . Sunscreens Rash  .  Calcium-Containing Compounds     Rash body, mouth ulcers  . Eggs Or Egg-Derived Products Diarrhea  . Other Diarrhea    Melon - bloating  . Pork Allergy Diarrhea  . Prunus Persica Diarrhea  . Sulfa Antibiotics Rash    Physical Exam  Constitutional: She is oriented to person, place, and time. She appears well-developed and well-nourished.  HENT:  Head: Normocephalic and atraumatic.  Eyes: Pupils are equal, round, and reactive to light. Conjunctivae are normal.  Cardiovascular: Normal rate, regular rhythm, normal heart sounds and intact distal pulses.   Pulmonary/Chest: Effort normal and breath sounds normal.  Abdominal: Soft. Bowel sounds are normal. She exhibits no distension and no mass. There is tenderness in the suprapubic area. There is no rebound, no guarding and no CVA tenderness.  Neurological: She is alert and oriented to person, place, and time. She has normal reflexes.  Skin: Skin is warm and dry. No rash noted.  Psychiatric: She has a normal mood and affect. Her behavior is normal. Judgment and thought content normal.    Results for orders placed or performed in visit on 06/21/17  Urinalysis  Result Value Ref Range   Specific Gravity, UA >1.030 (H) 1.005 - 1.030   pH, UA 5.0 5.0 - 7.5   Color, UA Yellow Yellow   Appearance Ur Cloudy (A) Clear   Leukocytes, UA Trace (A) Negative   Protein, UA Negative Negative/Trace   Glucose, UA Negative Negative   Ketones, UA Negative Negative   RBC, UA Negative Negative   Bilirubin, UA Negative Negative   Urobilinogen, Ur 0.2 0.2 - 1.0 mg/dL   Nitrite, UA Negative Negative      Assessment & Plan:   1. Dysuria - Urine Culture - Urinalysis - Ambulatory referral to Urology  2. Recurrent UTI (urinary tract infection) - doxycycline (VIBRA-TABS) 100 MG tablet; Take 1 tablet (100 mg total) by mouth 2 (two) times daily. 1 po bid  Dispense: 20 tablet; Refill: 1 - Ambulatory referral to Urology  3. Female cystocele - Ambulatory  referral to Urology  4. History of bladder suspension procedure - Ambulatory referral to Urology    Current Outpatient Prescriptions:  .  ALPRAZolam (XANAX) 0.25 MG tablet, , Disp: , Rfl:  .  Cholecalciferol (VITAMIN D3) 5000 units CAPS, Take by mouth., Disp: , Rfl:  .  cyclobenzaprine (FLEXERIL) 10 MG tablet, Take 1 tablet (10 mg total) by mouth at bedtime., Disp: 14 tablet, Rfl: 0 .  EPINEPHrine (EPIPEN 2-PAK) 0.3 mg/0.3 mL  IJ SOAJ injection, Inject 0.3 mLs (0.3 mg total) into the muscle once., Disp: 2 Device, Rfl: 0 .  estrogens, conjugated, (PREMARIN) 0.9 MG tablet, Take 1 tablet (0.9 mg total) by mouth daily., Disp: 90 tablet, Rfl: 1 .  fenofibrate (TRICOR) 145 MG tablet, Take 1 tablet (145 mg total) by mouth daily., Disp: 90 tablet, Rfl: 1 .  Flaxseed, Linseed, (FLAX SEED OIL) 1000 MG CAPS, Take by mouth., Disp: , Rfl:  .  lamoTRIgine (LAMICTAL) 100 MG tablet, , Disp: , Rfl:  .  omeprazole (PRILOSEC) 20 MG capsule, Take 1 capsule (20 mg total) by mouth daily., Disp: 90 capsule, Rfl: 1 .  topiramate (TOPAMAX) 100 MG tablet, Take by mouth., Disp: , Rfl:  .  doxycycline (VIBRA-TABS) 100 MG tablet, Take 1 tablet (100 mg total) by mouth 2 (two) times daily. 1 po bid, Disp: 20 tablet, Rfl: 1 Continue all other maintenance medications as listed above.  Follow up plan: Return if symptoms worsen or fail to improve.  Educational handout given for survey  Remus Loffler PA-C [facility] [address]  [place], [place] [address] [phone]   06/21/2017, 6:40 PM

## 2017-06-23 ENCOUNTER — Encounter: Payer: Self-pay | Admitting: Physician Assistant

## 2017-06-23 LAB — URINE CULTURE

## 2017-06-24 DIAGNOSIS — Z1231 Encounter for screening mammogram for malignant neoplasm of breast: Secondary | ICD-10-CM | POA: Diagnosis not present

## 2017-06-30 ENCOUNTER — Ambulatory Visit (INDEPENDENT_AMBULATORY_CARE_PROVIDER_SITE_OTHER): Payer: BLUE CROSS/BLUE SHIELD | Admitting: Pediatrics

## 2017-06-30 VITALS — BP 127/84 | HR 95 | Temp 97.4°F | Ht 64.0 in | Wt 225.0 lb

## 2017-06-30 DIAGNOSIS — G629 Polyneuropathy, unspecified: Secondary | ICD-10-CM

## 2017-06-30 DIAGNOSIS — K219 Gastro-esophageal reflux disease without esophagitis: Secondary | ICD-10-CM | POA: Diagnosis not present

## 2017-06-30 DIAGNOSIS — E8940 Asymptomatic postprocedural ovarian failure: Secondary | ICD-10-CM

## 2017-06-30 DIAGNOSIS — E785 Hyperlipidemia, unspecified: Secondary | ICD-10-CM | POA: Diagnosis not present

## 2017-06-30 MED ORDER — GABAPENTIN 300 MG PO CAPS: 300.0000 mg | ORAL_CAPSULE | Freq: Every day | ORAL | 3 refills | Status: DC

## 2017-06-30 MED ORDER — OMEPRAZOLE 20 MG PO CPDR: 20.0000 mg | DELAYED_RELEASE_CAPSULE | Freq: Two times a day (BID) | ORAL | 1 refills | Status: DC

## 2017-06-30 MED ORDER — ESTROGENS CONJUGATED 0.625 MG PO TABS: 0.6250 mg | ORAL_TABLET | Freq: Every day | ORAL | 5 refills | Status: DC

## 2017-06-30 MED ORDER — FENOFIBRATE 145 MG PO TABS: 145.0000 mg | ORAL_TABLET | Freq: Every day | ORAL | 1 refills | Status: DC

## 2017-06-30 NOTE — Progress Notes (Signed)
  Subjective:   Patient ID: Katie Joyce, female    DOB: 15-Jan-1961, 56 y.o.   MRN: [id number] CC: Follow up chronic medical problems  HPI: Katie Joyce is a 56 y.o. female presenting for Follow up chronic medical problems  Had bladder tacked 20 yrs ago Now with more pelvic pressure  Tobacco use: tried patches, chantix had reaction  GERD: ongoing symptoms despite daily omeprazole Taking it twice a day now abd pain from last visit continues, not as severe, some days more than others Has upcoming appt with GI  Bothered by burning in b/l feet, worse at night Feels like tingling Not bothering her as much during the day No numbness  Relevant past medical, surgical, family and social history reviewed. Allergies and medications reviewed and updated. History  Smoking Status  . Current Every Day Smoker  . Packs/day: 1.00  . Types: Cigarettes  Smokeless Tobacco  . Never Used   ROS: Per HPI   Objective:    BP 127/84   Pulse 95   Temp (!) 97.4 F (36.3 C) (Oral)   Ht 5\' 4"  (1.626 m)   Wt 225 lb (102.1 kg)   BMI 38.62 kg/m   Wt Readings from Last 3 Encounters:  06/30/17 225 lb (102.1 kg)  06/21/17 221 lb 12.8 oz (100.6 kg)  06/04/17 222 lb (100.7 kg)    Gen: NAD, alert, cooperative with exam, NCAT EYES: EOMI, no conjunctival injection, or no icterus ENT:   OP without erythema LYMPH: no cervical LAD CV: NRRR, normal S1/S2, no murmur, distal pulses 2+ b/l Resp: CTABL, no wheezes, normal WOB Abd: +BS, soft, mildly tender throughout with palpation. ND. no guarding or organomegaly Ext: No edema, warm Neuro: Alert and oriented, strength equal b/l UE and LE, coordination grossly normal, sensation intact b/l feet to touch, monofilament  Assessment & Plan:  Katie Joyce was seen today for follow up chronic medical problems.  Diagnoses and all orders for this visit:  Neuropathy New problem Trial of gabapentin Send labs -     gabapentin (NEURONTIN) 300 MG capsule; Take 1  capsule (300 mg total) by mouth at bedtime. -     Vitamin B12 -     Folate -     TSH  Postsurgical ovarian failure Weaning estrogen -     estrogens, conjugated, (PREMARIN) 0.625 MG tablet; Take 1 tablet (0.625 mg total) by mouth daily.   Hyperlipidemia, unspecified hyperlipidemia type Cont below -     fenofibrate (TRICOR) 145 MG tablet; Take 1 tablet (145 mg total) by mouth daily.  Gastroesophageal reflux disease, esophagitis presence not specified Ongoing symptoms Cont below BID F/u with GI as scheduled -     omeprazole (PRILOSEC) 20 MG capsule; Take 1 capsule (20 mg total) by mouth 2 (two) times daily before a meal.   Follow up plan: Return in about 6 months (around 12/28/2017). Rex Krasarol Lilana Blasko, MD [facility]

## 2017-07-01 LAB — VITAMIN B12: Vitamin B-12: 185 pg/mL — ABNORMAL LOW (ref 232–1245)

## 2017-07-01 LAB — TSH: TSH: 0.873 u[IU]/mL (ref 0.450–4.500)

## 2017-07-01 LAB — FOLATE: Folate: 8.5 ng/mL (ref >3.0)

## 2017-07-02 MED ORDER — ESTROGENS CONJUGATED 0.625 MG PO TABS: 0.6250 mg | ORAL_TABLET | Freq: Every day | ORAL | 5 refills | Status: DC

## 2017-07-05 ENCOUNTER — Encounter: Payer: Self-pay | Admitting: Pediatrics

## 2017-07-07 ENCOUNTER — Encounter (HOSPITAL_COMMUNITY): Payer: Self-pay | Admitting: Emergency Medicine

## 2017-07-07 ENCOUNTER — Other Ambulatory Visit: Payer: Self-pay

## 2017-07-07 ENCOUNTER — Emergency Department (HOSPITAL_COMMUNITY): Payer: BLUE CROSS/BLUE SHIELD

## 2017-07-07 ENCOUNTER — Telehealth: Payer: Self-pay | Admitting: Pediatrics

## 2017-07-07 ENCOUNTER — Emergency Department (HOSPITAL_COMMUNITY)
Admission: EM | Admit: 2017-07-07 | Discharge: 2017-07-07 | Disposition: A | Discharge status: Home / Self Care | Payer: BLUE CROSS/BLUE SHIELD | Attending: Emergency Medicine | Admitting: Emergency Medicine

## 2017-07-07 DIAGNOSIS — F17210 Nicotine dependence, cigarettes, uncomplicated: Secondary | ICD-10-CM | POA: Insufficient documentation

## 2017-07-07 DIAGNOSIS — K625 Hemorrhage of anus and rectum: Secondary | ICD-10-CM

## 2017-07-07 DIAGNOSIS — R1033 Periumbilical pain: Secondary | ICD-10-CM | POA: Insufficient documentation

## 2017-07-07 DIAGNOSIS — R197 Diarrhea, unspecified: Secondary | ICD-10-CM | POA: Diagnosis not present

## 2017-07-07 HISTORY — DX: Pure hypercholesterolemia, unspecified: E78.00

## 2017-07-07 HISTORY — DX: Gastro-esophageal reflux disease without esophagitis: K21.9

## 2017-07-07 HISTORY — DX: Iron deficiency: E61.1

## 2017-07-07 LAB — CBC WITH DIFFERENTIAL/PLATELET
Basophils Absolute: 0 10*3/uL (ref 0.0–0.1)
Basophils Relative: 0 %
EOS ABS: 0.2 10*3/uL (ref 0.0–0.7)
EOS PCT: 2 %
HCT: 41.3 % (ref 36.0–46.0)
Hemoglobin: 12.7 g/dL (ref 12.0–15.0)
LYMPHS ABS: 3.4 10*3/uL (ref 0.7–4.0)
LYMPHS PCT: 38 %
MCH: 27.1 pg (ref 26.0–34.0)
MCHC: 30.8 g/dL (ref 30.0–36.0)
MCV: 88.2 fL (ref 78.0–100.0)
MONO ABS: 0.7 10*3/uL (ref 0.1–1.0)
MONOS PCT: 8 %
Neutro Abs: 4.6 10*3/uL (ref 1.7–7.7)
Neutrophils Relative %: 52 %
PLATELETS: 306 10*3/uL (ref 150–400)
RBC: 4.68 MIL/uL (ref 3.87–5.11)
RDW: 14.6 % (ref 11.5–15.5)
WBC: 9 10*3/uL (ref 4.0–10.5)

## 2017-07-07 LAB — COMPREHENSIVE METABOLIC PANEL
ALBUMIN: 3.9 g/dL (ref 3.5–5.0)
ALT: 13 U/L — AB (ref 14–54)
AST: 17 U/L (ref 15–41)
Alkaline Phosphatase: 53 U/L (ref 38–126)
Anion gap: 10 (ref 5–15)
BUN: 15 mg/dL (ref 6–20)
CHLORIDE: 102 mmol/L (ref 101–111)
CO2: 24 mmol/L (ref 22–32)
CREATININE: 0.93 mg/dL (ref 0.44–1.00)
Calcium: 9.5 mg/dL (ref 8.9–10.3)
GFR calc Af Amer: >60 mL/min (ref >60)
GLUCOSE: 99 mg/dL (ref 65–99)
Potassium: 4.2 mmol/L (ref 3.5–5.1)
SODIUM: 136 mmol/L (ref 135–145)
Total Bilirubin: 0.4 mg/dL (ref 0.3–1.2)
Total Protein: 7.2 g/dL (ref 6.5–8.1)

## 2017-07-07 LAB — I-STAT CHEM 8, ED
BUN: 15 mg/dL (ref 6–20)
Calcium, Ion: 1.18 mmol/L (ref 1.15–1.40)
Chloride: 106 mmol/L (ref 101–111)
Creatinine, Ser: 0.9 mg/dL (ref 0.44–1.00)
GLUCOSE: 109 mg/dL — AB (ref 65–99)
HCT: 35 % — ABNORMAL LOW (ref 36.0–46.0)
Hemoglobin: 11.9 g/dL — ABNORMAL LOW (ref 12.0–15.0)
Potassium: 4.3 mmol/L (ref 3.5–5.1)
SODIUM: 140 mmol/L (ref 135–145)
TCO2: 24 mmol/L (ref 22–32)

## 2017-07-07 MED ORDER — MORPHINE SULFATE (PF) 4 MG/ML IV SOLN
6.0000 mg | Freq: Once | INTRAVENOUS | Status: AC
  Administered 2017-07-07: 6 mg via INTRAVENOUS
  Filled 2017-07-07: qty 2

## 2017-07-07 MED ORDER — SODIUM CHLORIDE 0.9 % IV BOLUS (SEPSIS)
1000.0000 mL | Freq: Once | INTRAVENOUS | Status: AC
  Administered 2017-07-07: 1000 mL via INTRAVENOUS

## 2017-07-07 MED ORDER — IOPAMIDOL (ISOVUE-300) INJECTION 61%
100.0000 mL | Freq: Once | INTRAVENOUS | Status: AC | PRN
  Administered 2017-07-07: 100 mL via INTRAVENOUS

## 2017-07-07 NOTE — ED Provider Notes (Signed)
Emergency Department Provider Note   I have reviewed the triage vital signs and the nursing notes.   HISTORY  Chief Complaint Abdominal Pain and Blood In Stools   HPI Katie Joyce is a 56 y.o. female with a past medical history of reflux, hyperlipidemia, polyp; who presents emerged department today for rectal bleeding.  Patient states that she has had some abdominal pain for the last couple months she actually has an appointment with gastroenterology on Tuesday to evaluate for the same.  However today she had multiple episodes of blood that was bright red or maroon color in her stool so she came here for evaluation.  She also states she has a little bit of "wonkiness in her head".  No pale skin, chest pain, shortness of breath or other associated symptoms.    Past Medical History:  Diagnosis Date  . Bipolar 1 disorder (HCC)   . Degenerative disc disease, lumbar   . GERD (gastroesophageal reflux disease)   . Hypercholesteremia   . Low iron     Patient Active Problem List   Diagnosis Date Noted  . History of bladder suspension procedure 06/21/2017  . Female cystocele 06/21/2017  . Recurrent UTI (urinary tract infection) 06/21/2017  . 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 Bipolar I disorder (HCC) 02/28/2016  . Acid reflux 02/28/2016  . History of tobacco use 02/28/2016  . HLD (hyperlipidemia) 02/28/2016  . Class 2 obesity 02/28/2016    Past Surgical History:  Procedure Laterality Date  . APPENDECTOMY    . CHOLECYSTECTOMY    . EYE SURGERY    . TONSILLECTOMY AND ADENOIDECTOMY    . VAGINAL HYSTERECTOMY      Current Outpatient Rx  . Order #: [id number] Class: Historical Med  . Order #: [id number] Class: Historical Med  . Order #: [id number] Class: Normal  . Order #: [id number] Class: Normal  . Order #: [id number] Class: Normal  . Order #: [id number] Class: Historical Med  . Order #: [id number] Class: Historical Med  . Order #: [id number] Class: Historical Med  . Order #:  [id number] Class: Normal  . Order #: [id number] Class: Historical Med  . Order #: [id number] Class: Normal  . Order #: [id number] Class: Historical Med    Allergies Aspirin; Bee venom; Ciprofloxacin; Coconut oil; Fish allergy; Niacin and related; Oatmeal; Peanut-containing drug products; Penicillins; Shellfish allergy; Clarithromycin; Lac bovis; Nitrofurantoin; Sunscreens; Calcium-containing compounds; Keflex [cephalexin]; Eggs or egg-derived products; Other; Pork allergy; Prunus persica; and Sulfa antibiotics  History reviewed. No pertinent family history.  Social History Social History   Tobacco Use  . Smoking status: Current Every Day Smoker    Packs/day: 1.00    Types: Cigarettes  . Smokeless tobacco: Never Used  Substance Use Topics  . Alcohol use: No  . Drug use: No    Review of Systems  All other systems negative except as documented in the HPI. All pertinent positives and negatives as reviewed in the HPI. ____________________________________________   PHYSICAL EXAM:  VITAL SIGNS: ED Triage Vitals  Enc Vitals Group     BP 07/07/17 1730 (!) 156/99     Pulse Rate 07/07/17 1730 92     Resp 07/07/17 1730 18     Temp 07/07/17 1730 98.2 F (36.8 C)     Temp Source 07/07/17 1730 Oral     SpO2 07/07/17 1730 97 %    Constitutional: Alert and oriented. Well appearing and in no acute distress. Eyes: Conjunctivae are normal. PERRL. EOMI. Head: Atraumatic. Nose: No congestion/rhinnorhea. Mouth/Throat: Mucous membranes are moist.  Oropharynx non-erythematous. Neck:  No stridor.  No meningeal signs.   Cardiovascular: Normal rate, regular rhythm. Good peripheral circulation. Grossly normal heart sounds.   Respiratory: Normal respiratory effort.  No retractions. Lungs CTAB. Gastrointestinal: Soft and nontender. No distention.  Musculoskeletal: No lower extremity tenderness nor edema. No gross deformities of extremities. Neurologic:  Normal speech and language. No gross focal  neurologic deficits are appreciated.  Skin:  Skin is warm, dry and intact. No rash noted.   ____________________________________________   LABS (all labs ordered are listed, but only abnormal results are displayed)  Labs Reviewed  COMPREHENSIVE METABOLIC PANEL - Abnormal; Notable for the following components:      Result Value   ALT 13 (*)    All other components within normal limits  I-STAT CHEM 8, ED - Abnormal; Notable for the following components:   Glucose, Bld 109 (*)    Hemoglobin 11.9 (*)    HCT 35.0 (*)    All other components within normal limits  CBC WITH DIFFERENTIAL/PLATELET  URINALYSIS, ROUTINE W REFLEX MICROSCOPIC   ____________________________________________  RADIOLOGY  Ct Abdomen Pelvis W Contrast  Result Date: 07/07/2017 CLINICAL DATA:  Severe sharp umbilical and bilateral flank pain with diarrhea and hematochezia. EXAM: CT ABDOMEN AND PELVIS WITH CONTRAST TECHNIQUE: Multidetector CT imaging of the abdomen and pelvis was performed using the standard protocol following bolus administration of intravenous contrast. CONTRAST:  100mL ISOVUE-300 IOPAMIDOL (ISOVUE-300) INJECTION 61% COMPARISON:  03/15/2017 FINDINGS: Lower chest: Borderline cardiomegaly without pericardial effusion. There is atelectasis and/or scarring at the lung bases. Hepatobiliary: Hepatic steatosis without space-occupying mass. Status post cholecystectomy. Pancreas: Unremarkable. No pancreatic ductal dilatation or surrounding inflammatory changes. Spleen: Normal in size without focal abnormality. Adrenals/Urinary Tract: Stable 7 mm exophytic left lower pole renal cyst. No solid enhancing masses of either kidney. Normal bilateral adrenal glands. No nephrolithiasis nor obstructive uropathy. The urinary bladder is physiologically distended. Stomach/Bowel: The stomach is nondistended. There is normal small bowel rotation with the ligament of Treitz position. No small bowel dilatation or obstruction. Status  post appendectomy. There is descending and sigmoid diverticulosis without acute diverticulitis. No evidence of acute colitis. No annular constricting lesions. Right-sided cecal diverticulum is also seen. Vascular/Lymphatic: Minimal aortic atherosclerosis. No aneurysm or dissection. No lymphadenopathy. Stable 8 mm retroperitoneal lymph node adjacent to the left adrenal gland. Reproductive: Status post hysterectomy. No adnexal masses. Other: No abdominal wall hernia or abnormality. No abdominopelvic ascites. Musculoskeletal: No acute or significant osseous findings. IMPRESSION: 1. Colonic diverticulosis without evidence of acute diverticulitis. No bowel inflammation or obstruction. 2. Hepatic steatosis.  Status post cholecystectomy. 3. Stable 7 mm exophytic left lower pole renal cyst. Electronically Signed   By: Tollie Ethavid  Kwon M.D.   On: 07/07/2017 22:13    ____________________________________________   PROCEDURES  Procedure(s) performed:   Procedures   ____________________________________________   INITIAL IMPRESSION / ASSESSMENT AND PLAN / ED COURSE  Pertinent labs & imaging results that were available during my care of the patient were reviewed by me and considered in my medical decision making (see chart for details).  Patient with rectal bleeding but no evidence for hemodynamic compromise.  Delta hemoglobins were relatively close.  No tachycardia.  No other evidence of acute blood loss anemia.  Will continue following up with her gastroenterologist for possible evaluation.  ____________________________________________  FINAL CLINICAL IMPRESSION(S) / ED DIAGNOSES  Final diagnoses:  Rectal bleeding     MEDICATIONS GIVEN DURING THIS VISIT:  Medications  sodium chloride 0.9 % bolus 1,000 mL (0 mLs Intravenous Stopped 07/07/17 2355)  morphine 4 MG/ML injection 6 mg (6 mg Intravenous Given 07/07/17 2121)  iopamidol (ISOVUE-300) 61 % injection 100 mL (100 mLs Intravenous Contrast Given  07/07/17 2140)     NEW OUTPATIENT MEDICATIONS STARTED DURING THIS VISIT:  This SmartLink is deprecated. Use AVSMEDLIST instead to display the medication list for a patient.  Note:  This document was prepared using Dragon voice recognition software and may include unintentional dictation errors.   Marily Memos, MD 07/07/17 [phone]

## 2017-07-07 NOTE — Telephone Encounter (Signed)
Pt having severe abdominal pain with diarrhea and hematochezia Pt has had 4-5 episodes that started today Pt instructed to go to ER

## 2017-07-07 NOTE — ED Notes (Signed)
Blood drawn by this RN, pt updated on wait.

## 2017-07-07 NOTE — Telephone Encounter (Signed)
What symptoms do you have? Loose bowel movements with blood  How long have you been sick? 2 hours ago  Have you been seen for this problem? no  If your provider decides to give you a prescription, which pharmacy would you like for it to be sent to? Does she need to be seen   Patient informed that this information will be sent to the clinical staff for review and that they should receive a follow up call.

## 2017-07-13 ENCOUNTER — Other Ambulatory Visit: Payer: BLUE CROSS/BLUE SHIELD

## 2017-07-13 ENCOUNTER — Other Ambulatory Visit: Payer: Self-pay | Admitting: *Deleted

## 2017-07-13 ENCOUNTER — Encounter: Payer: Self-pay | Admitting: *Deleted

## 2017-07-13 ENCOUNTER — Telehealth: Payer: Self-pay | Admitting: *Deleted

## 2017-07-13 ENCOUNTER — Ambulatory Visit (INDEPENDENT_AMBULATORY_CARE_PROVIDER_SITE_OTHER): Payer: BLUE CROSS/BLUE SHIELD | Admitting: Gastroenterology

## 2017-07-13 ENCOUNTER — Encounter: Payer: Self-pay | Admitting: Gastroenterology

## 2017-07-13 VITALS — BP 151/89 | HR 100 | Temp 97.3°F | Ht 63.0 in | Wt 220.0 lb

## 2017-07-13 DIAGNOSIS — K219 Gastro-esophageal reflux disease without esophagitis: Secondary | ICD-10-CM

## 2017-07-13 DIAGNOSIS — R1030 Lower abdominal pain, unspecified: Secondary | ICD-10-CM | POA: Diagnosis not present

## 2017-07-13 DIAGNOSIS — R1013 Epigastric pain: Secondary | ICD-10-CM

## 2017-07-13 DIAGNOSIS — K625 Hemorrhage of anus and rectum: Secondary | ICD-10-CM | POA: Diagnosis not present

## 2017-07-13 DIAGNOSIS — E538 Deficiency of other specified B group vitamins: Secondary | ICD-10-CM

## 2017-07-13 DIAGNOSIS — R109 Unspecified abdominal pain: Secondary | ICD-10-CM | POA: Insufficient documentation

## 2017-07-13 MED ORDER — PEG 3350-KCL-NA BICARB-NACL 420 G PO SOLR: 4000.0000 mL | Freq: Once | ORAL | 0 refills | Status: AC

## 2017-07-13 NOTE — Patient Instructions (Signed)
Spoke with Verlon AuLeslie. Patient has not had any iron since 07/12/17. Pt needs to hold iron x 7 days prior to TCS. She stated it was okay for pt to have TCS on 07/19/17 with that being 6 days of iron being held.

## 2017-07-13 NOTE — Telephone Encounter (Signed)
Called spoke with pt. Pre-op appt is scheduled for 07/16/17 at 8:00am.

## 2017-07-13 NOTE — Progress Notes (Signed)
Primary Care Physician:  Vincent, Carol L, MD  Primary Gastroenterologist:  Michael Rourk, MD   Chief Complaint  Patient presents with  . Abdominal Pain  . Rectal Bleeding    bright red; went to ER last week; tcs 3 yrs ago    HPI:  Katie Joyce is a 56 y.o. female here at the request of Dr. Vincent for further evaluation of epigastric pain.  Initial referral came in in July.  Patient was unable to make her initial appointment.  July started having abdominal pain.  Pain seem to be worse with movement, walking.  Did not seem to get better with a bowel movement.  Persistent and lasted for several weeks but then became more intermittent in nature.  CT scan in July with contrast showed mild diffuse hepatic steatosis, mild colonic diverticulosis.  She had a 9 x 7 mm oval mass medial to the left adrenal gland, possibly a reactive lymph node.    Now complaining of postprandial feeling of fullness or not in the epigastrium region with diffuse discomfort encompassing the whole stomach.  Last Wednesday she had 4-5 episodes of large volume fresh blood per rectum but no clots.  It was seen in the emergency department and the bleeding had subsided.  Hemoglobin 12.7 on presentation, i-STAT hemoglobin was 11.9.  She denies melena.  She keeps heartburn all the time.  Recently went from omeprazole daily to twice daily but still having nocturnal symptoms.  No dysphagia, vomiting.  Appetite not as good as it used to be.  She has had some burning in her legs.  Recently her B12 was noted to be low, oral supplements provided initially with plans for recheck by PCP.  While in the ED on November 7, she had another CT abdomen/pelvis with contrast.  She had hepatic steatosis, stable 7 mm exophytic left lower pole renal cyst.  Colonic diverticulosis.   Current Outpatient Medications  Medication Sig Dispense Refill  . ALPRAZolam (XANAX) 0.25 MG tablet Take 0.25 mg at bedtime by mouth.     . Cholecalciferol (VITAMIN  D3) 5000 units CAPS Take 1 capsule every evening by mouth.     . diphenhydrAMINE (BENADRYL) 25 mg capsule Take 50 mg at bedtime by mouth.    . EPINEPHrine (EPIPEN 2-PAK) 0.3 mg/0.3 mL IJ SOAJ injection Inject 0.3 mLs (0.3 mg total) into the muscle once. 2 Device 0  . estrogens, conjugated, (PREMARIN) 0.625 MG tablet Take 1 tablet (0.625 mg total) by mouth daily. 30 tablet 5  . fenofibrate (TRICOR) 145 MG tablet Take 1 tablet (145 mg total) by mouth daily. 90 tablet 1  . ferrous sulfate 325 (65 FE) MG EC tablet Take 325 mg daily by mouth.     . fexofenadine (ALLEGRA) 180 MG tablet Take 180 mg every evening by mouth.    . Flaxseed, Linseed, (FLAX SEED OIL) 1000 MG CAPS Take 1 capsule every evening by mouth.     . gabapentin (NEURONTIN) 300 MG capsule Take 1 capsule (300 mg total) by mouth at bedtime. 30 capsule 3  . lamoTRIgine (LAMICTAL) 100 MG tablet Take 100 mg every evening by mouth.     . omeprazole (PRILOSEC) 20 MG capsule Take 1 capsule (20 mg total) by mouth 2 (two) times daily before a meal. 180 capsule 1  . topiramate (TOPAMAX) 100 MG tablet Take 100 mg every evening by mouth.      No current facility-administered medications for this visit.     Allergies as of 07/13/2017 -   Review Complete 07/13/2017  Allergen Reaction Noted  . Aspirin Anaphylaxis and Swelling 02/28/2016  . Bee venom Anaphylaxis 02/28/2016  . Ciprofloxacin Shortness Of Breath and Palpitations 06/23/2017  . Coconut oil Anaphylaxis 02/28/2016  . Fish allergy Anaphylaxis 02/28/2016  . Niacin and related Rash 02/28/2016  . Oatmeal Anaphylaxis 02/28/2016  . Peanut-containing drug products Anaphylaxis 02/28/2016  . Penicillins Anaphylaxis 02/28/2016  . Shellfish allergy Anaphylaxis 02/28/2016  . Clarithromycin Nausea And Vomiting 02/28/2016  . Lac bovis Rash 02/28/2016  . Nitrofurantoin Rash 02/28/2016  . Sunscreens Rash 02/28/2016  . Calcium-containing compounds  02/28/2016  . Keflex [cephalexin] Hives 06/30/2017   . Eggs or egg-derived products Diarrhea 02/28/2016  . Other Diarrhea 02/28/2016  . Pork allergy Diarrhea 02/28/2016  . Prunus persica Diarrhea 02/28/2016  . Sulfa antibiotics Rash 02/28/2016    Past Medical History:  Diagnosis Date  . Bipolar 1 disorder (HCC)   . Degenerative disc disease, lumbar   . GERD (gastroesophageal reflux disease)   . Hypercholesteremia   . Low iron     Past Surgical History:  Procedure Laterality Date  . APPENDECTOMY    . CHOLECYSTECTOMY    . EYE SURGERY    . TONSILLECTOMY AND ADENOIDECTOMY    . VAGINAL HYSTERECTOMY      Family History  Problem Relation Age of Onset  . Colon cancer Neg Hx   . Inflammatory bowel disease Neg Hx   . Celiac disease Neg Hx     Social History   Socioeconomic History  . Marital status: Married    Spouse name: Not on file  . Number of children: Not on file  . Years of education: Not on file  . Highest education level: Not on file  Social Needs  . Financial resource strain: Not on file  . Food insecurity - worry: Not on file  . Food insecurity - inability: Not on file  . Transportation needs - medical: Not on file  . Transportation needs - non-medical: Not on file  Occupational History  . Not on file  Tobacco Use  . Smoking status: Current Every Day Smoker    Packs/day: 1.00    Types: Cigarettes  . Smokeless tobacco: Never Used  Substance and Sexual Activity  . Alcohol use: No  . Drug use: No  . Sexual activity: Yes  Other Topics Concern  . Not on file  Social History Narrative  . Not on file      ROS:  General: Negative for anorexia, weight loss, fever, chills, fatigue, weakness. Eyes: Negative for vision changes.  ENT: Negative for hoarseness, difficulty swallowing , nasal congestion. CV: Negative for chest pain, angina, palpitations, dyspnea on exertion, peripheral edema.  Respiratory: Negative for dyspnea at rest, dyspnea on exertion, cough, sputum, wheezing.  GI: See history of present  illness. GU:  Negative for dysuria, hematuria, urinary incontinence, urinary frequency, nocturnal urination.  MS: Lower extremity burning type pain, no low back pain.  Derm: Negative for rash or itching.  Neuro: Negative for weakness, abnormal sensation, seizure, frequent headaches, memory loss, confusion.  Psych: Negative for anxiety, depression, suicidal ideation, hallucinations.  Endo: Negative for unusual weight change.  Heme: Negative for bruising or bleeding. Allergy: Negative for rash or hives.    Physical Examination:  BP (!) 151/89   Pulse 100   Temp (!) 97.3 F (36.3 C) (Oral)   Ht 5' 3" (1.6 m)   Wt 220 lb (99.8 kg)   BMI 38.97 kg/m    General: Well-nourished, well-developed in   no acute distress.  Head: Normocephalic, atraumatic.   Eyes: Conjunctiva pink, no icterus. Mouth: Oropharyngeal mucosa moist and pink , no lesions erythema or exudate. Neck: Supple without thyromegaly, masses, or lymphadenopathy.  Lungs: Clear to auscultation bilaterally.  Heart: Regular rate and rhythm, no murmurs rubs or gallops.  Abdomen: Bowel sounds are normal, moderate epigastric tenderness, mild lower abdominal tenderness, nondistended, no hepatosplenomegaly or masses, no abdominal bruits or    hernia , no rebound or guarding.   Rectal: Not performed Extremities: No lower extremity edema. No clubbing or deformities.  Neuro: Alert and oriented x 4 , grossly normal neurologically.  Skin: Warm and dry, no rash or jaundice.   Psych: Alert and cooperative, normal mood and affect.  Labs: Lab Results  Component Value Date   CREATININE 0.90 07/07/2017   BUN 15 07/07/2017   NA 140 07/07/2017   K 4.3 07/07/2017   CL 106 07/07/2017   CO2 24 07/07/2017   Lab Results  Component Value Date   ALT 13 (L) 07/07/2017   AST 17 07/07/2017   ALKPHOS 53 07/07/2017   BILITOT 0.4 07/07/2017   Lab Results  Component Value Date   WBC 9.0 07/07/2017   HGB 11.9 (L) 07/07/2017   HCT 35.0 (L)  07/07/2017   MCV 88.2 07/07/2017   PLT 306 07/07/2017   No results found for: LIPASE  Lab Results  Component Value Date   TSH 0.873 06/30/2017   Lab Results  Component Value Date   IRON 45 06/05/2017   TIBC 469 (H) 06/05/2017   FERRITIN 20 06/05/2017   Lab Results  Component Value Date   VITAMINB12 185 (L) 06/30/2017   Lab Results  Component Value Date   FOLATE 8.5 06/30/2017     Imaging Studies: Ct Abdomen Pelvis W Contrast  Result Date: 07/07/2017 CLINICAL DATA:  Severe sharp umbilical and bilateral flank pain with diarrhea and hematochezia. EXAM: CT ABDOMEN AND PELVIS WITH CONTRAST TECHNIQUE: Multidetector CT imaging of the abdomen and pelvis was performed using the standard protocol following bolus administration of intravenous contrast. CONTRAST:  100mL ISOVUE-300 IOPAMIDOL (ISOVUE-300) INJECTION 61% COMPARISON:  03/15/2017 FINDINGS: Lower chest: Borderline cardiomegaly without pericardial effusion. There is atelectasis and/or scarring at the lung bases. Hepatobiliary: Hepatic steatosis without space-occupying mass. Status post cholecystectomy. Pancreas: Unremarkable. No pancreatic ductal dilatation or surrounding inflammatory changes. Spleen: Normal in size without focal abnormality. Adrenals/Urinary Tract: Stable 7 mm exophytic left lower pole renal cyst. No solid enhancing masses of either kidney. Normal bilateral adrenal glands. No nephrolithiasis nor obstructive uropathy. The urinary bladder is physiologically distended. Stomach/Bowel: The stomach is nondistended. There is normal small bowel rotation with the ligament of Treitz position. No small bowel dilatation or obstruction. Status post appendectomy. There is descending and sigmoid diverticulosis without acute diverticulitis. No evidence of acute colitis. No annular constricting lesions. Right-sided cecal diverticulum is also seen. Vascular/Lymphatic: Minimal aortic atherosclerosis. No aneurysm or dissection. No  lymphadenopathy. Stable 8 mm retroperitoneal lymph node adjacent to the left adrenal gland. Reproductive: Status post hysterectomy. No adnexal masses. Other: No abdominal wall hernia or abnormality. No abdominopelvic ascites. Musculoskeletal: No acute or significant osseous findings. IMPRESSION: 1. Colonic diverticulosis without evidence of acute diverticulitis. No bowel inflammation or obstruction. 2. Hepatic steatosis.  Status post cholecystectomy. 3. Stable 7 mm exophytic left lower pole renal cyst. Electronically Signed   By: David  Kwon M.D.   On: 07/07/2017 22:13   Impression/plan:  56-year-old female with several month history of nonspecific upper abdominal pain initially   lasting for several weeks but then became more intermittent.  More recently had several episodes of moderate volume hematochezia with slight drop in hemoglobin while in the ED.  She has refractory GERD on twice daily PPI.  Recent diagnosis of B12 deficiency without clear etiology.  Cannot rule out complicated GERD, peptic ulcer disease.  Need to exclude celiac disease.  She may have recently had a diverticular bleed given clinical scenario.  Will update CBC.  Plan on EGD and colonoscopy in the near future with deep sedation in the OR given polypharmacy.risks   

## 2017-07-13 NOTE — H&P (View-Only) (Signed)
Primary Care Physician:  Johna Sheriff, MD  Primary Gastroenterologist:  Roetta Sessions, MD   Chief Complaint  Patient presents with  . Abdominal Pain  . Rectal Bleeding    bright red; went to ER last week; tcs 3 yrs ago    HPI:  Katie Joyce is a 56 y.o. female here at the request of Dr. Oswaldo Done for further evaluation of epigastric pain.  Initial referral came in in July.  Patient was unable to make her initial appointment.  July started having abdominal pain.  Pain seem to be worse with movement, walking.  Did not seem to get better with a bowel movement.  Persistent and lasted for several weeks but then became more intermittent in nature.  CT scan in July with contrast showed mild diffuse hepatic steatosis, mild colonic diverticulosis.  She had a 9 x 7 mm oval mass medial to the left adrenal gland, possibly a reactive lymph node.    Now complaining of postprandial feeling of fullness or not in the epigastrium region with diffuse discomfort encompassing the whole stomach.  Last Wednesday she had 4-5 episodes of large volume fresh blood per rectum but no clots.  It was seen in the emergency department and the bleeding had subsided.  Hemoglobin 12.7 on presentation, i-STAT hemoglobin was 11.9.  She denies melena.  She keeps heartburn all the time.  Recently went from omeprazole daily to twice daily but still having nocturnal symptoms.  No dysphagia, vomiting.  Appetite not as good as it used to be.  She has had some burning in her legs.  Recently her B12 was noted to be low, oral supplements provided initially with plans for recheck by PCP.  While in the ED on November 7, she had another CT abdomen/pelvis with contrast.  She had hepatic steatosis, stable 7 mm exophytic left lower pole renal cyst.  Colonic diverticulosis.   Current Outpatient Medications  Medication Sig Dispense Refill  . ALPRAZolam (XANAX) 0.25 MG tablet Take 0.25 mg at bedtime by mouth.     . Cholecalciferol (VITAMIN  D3) 5000 units CAPS Take 1 capsule every evening by mouth.     . diphenhydrAMINE (BENADRYL) 25 mg capsule Take 50 mg at bedtime by mouth.    . EPINEPHrine (EPIPEN 2-PAK) 0.3 mg/0.3 mL IJ SOAJ injection Inject 0.3 mLs (0.3 mg total) into the muscle once. 2 Device 0  . estrogens, conjugated, (PREMARIN) 0.625 MG tablet Take 1 tablet (0.625 mg total) by mouth daily. 30 tablet 5  . fenofibrate (TRICOR) 145 MG tablet Take 1 tablet (145 mg total) by mouth daily. 90 tablet 1  . ferrous sulfate 325 (65 FE) MG EC tablet Take 325 mg daily by mouth.     . fexofenadine (ALLEGRA) 180 MG tablet Take 180 mg every evening by mouth.    . Flaxseed, Linseed, (FLAX SEED OIL) 1000 MG CAPS Take 1 capsule every evening by mouth.     . gabapentin (NEURONTIN) 300 MG capsule Take 1 capsule (300 mg total) by mouth at bedtime. 30 capsule 3  . lamoTRIgine (LAMICTAL) 100 MG tablet Take 100 mg every evening by mouth.     Marland Kitchen omeprazole (PRILOSEC) 20 MG capsule Take 1 capsule (20 mg total) by mouth 2 (two) times daily before a meal. 180 capsule 1  . topiramate (TOPAMAX) 100 MG tablet Take 100 mg every evening by mouth.      No current facility-administered medications for this visit.     Allergies as of 07/13/2017 -  Review Complete 07/13/2017  Allergen Reaction Noted  . Aspirin Anaphylaxis and Swelling 02/28/2016  . Bee venom Anaphylaxis 02/28/2016  . Ciprofloxacin Shortness Of Breath and Palpitations 06/23/2017  . Coconut oil Anaphylaxis 02/28/2016  . Fish allergy Anaphylaxis 02/28/2016  . Niacin and related Rash 02/28/2016  . Oatmeal Anaphylaxis 02/28/2016  . Peanut-containing drug products Anaphylaxis 02/28/2016  . Penicillins Anaphylaxis 02/28/2016  . Shellfish allergy Anaphylaxis 02/28/2016  . Clarithromycin Nausea And Vomiting 02/28/2016  . Lac bovis Rash 02/28/2016  . Nitrofurantoin Rash 02/28/2016  . Sunscreens Rash 02/28/2016  . Calcium-containing compounds  02/28/2016  . Keflex [cephalexin] Hives 06/30/2017   . Eggs or egg-derived products Diarrhea 02/28/2016  . Other Diarrhea 02/28/2016  . Pork allergy Diarrhea 02/28/2016  . Prunus persica Diarrhea 02/28/2016  . Sulfa antibiotics Rash 02/28/2016    Past Medical History:  Diagnosis Date  . Bipolar 1 disorder (HCC)   . Degenerative disc disease, lumbar   . GERD (gastroesophageal reflux disease)   . Hypercholesteremia   . Low iron     Past Surgical History:  Procedure Laterality Date  . APPENDECTOMY    . CHOLECYSTECTOMY    . EYE SURGERY    . TONSILLECTOMY AND ADENOIDECTOMY    . VAGINAL HYSTERECTOMY      Family History  Problem Relation Age of Onset  . Colon cancer Neg Hx   . Inflammatory bowel disease Neg Hx   . Celiac disease Neg Hx     Social History   Socioeconomic History  . Marital status: Married    Spouse name: Not on file  . Number of children: Not on file  . Years of education: Not on file  . Highest education level: Not on file  Social Needs  . Financial resource strain: Not on file  . Food insecurity - worry: Not on file  . Food insecurity - inability: Not on file  . Transportation needs - medical: Not on file  . Transportation needs - non-medical: Not on file  Occupational History  . Not on file  Tobacco Use  . Smoking status: Current Every Day Smoker    Packs/day: 1.00    Types: Cigarettes  . Smokeless tobacco: Never Used  Substance and Sexual Activity  . Alcohol use: No  . Drug use: No  . Sexual activity: Yes  Other Topics Concern  . Not on file  Social History Narrative  . Not on file      ROS:  General: Negative for anorexia, weight loss, fever, chills, fatigue, weakness. Eyes: Negative for vision changes.  ENT: Negative for hoarseness, difficulty swallowing , nasal congestion. CV: Negative for chest pain, angina, palpitations, dyspnea on exertion, peripheral edema.  Respiratory: Negative for dyspnea at rest, dyspnea on exertion, cough, sputum, wheezing.  GI: See history of present  illness. GU:  Negative for dysuria, hematuria, urinary incontinence, urinary frequency, nocturnal urination.  MS: Lower extremity burning type pain, no low back pain.  Derm: Negative for rash or itching.  Neuro: Negative for weakness, abnormal sensation, seizure, frequent headaches, memory loss, confusion.  Psych: Negative for anxiety, depression, suicidal ideation, hallucinations.  Endo: Negative for unusual weight change.  Heme: Negative for bruising or bleeding. Allergy: Negative for rash or hives.    Physical Examination:  BP (!) 151/89   Pulse 100   Temp (!) 97.3 F (36.3 C) (Oral)   Ht 5\' 3"  (1.6 m)   Wt 220 lb (99.8 kg)   BMI 38.97 kg/m    General: Well-nourished, well-developed in  no acute distress.  Head: Normocephalic, atraumatic.   Eyes: Conjunctiva pink, no icterus. Mouth: Oropharyngeal mucosa moist and pink , no lesions erythema or exudate. Neck: Supple without thyromegaly, masses, or lymphadenopathy.  Lungs: Clear to auscultation bilaterally.  Heart: Regular rate and rhythm, no murmurs rubs or gallops.  Abdomen: Bowel sounds are normal, moderate epigastric tenderness, mild lower abdominal tenderness, nondistended, no hepatosplenomegaly or masses, no abdominal bruits or    hernia , no rebound or guarding.   Rectal: Not performed Extremities: No lower extremity edema. No clubbing or deformities.  Neuro: Alert and oriented x 4 , grossly normal neurologically.  Skin: Warm and dry, no rash or jaundice.   Psych: Alert and cooperative, normal mood and affect.  Labs: Lab Results  Component Value Date   CREATININE 0.90 07/07/2017   BUN 15 07/07/2017   NA 140 07/07/2017   K 4.3 07/07/2017   CL 106 07/07/2017   CO2 24 07/07/2017   Lab Results  Component Value Date   ALT 13 (L) 07/07/2017   AST 17 07/07/2017   ALKPHOS 53 07/07/2017   BILITOT 0.4 07/07/2017   Lab Results  Component Value Date   WBC 9.0 07/07/2017   HGB 11.9 (L) 07/07/2017   HCT 35.0 (L)  07/07/2017   MCV 88.2 07/07/2017   PLT 306 07/07/2017   No results found for: LIPASE  Lab Results  Component Value Date   TSH 0.873 06/30/2017   Lab Results  Component Value Date   IRON 45 06/05/2017   TIBC 469 (H) 06/05/2017   FERRITIN 20 06/05/2017   Lab Results  Component Value Date   VITAMINB12 185 (L) 06/30/2017   Lab Results  Component Value Date   FOLATE 8.5 06/30/2017     Imaging Studies: Ct Abdomen Pelvis W Contrast  Result Date: 07/07/2017 CLINICAL DATA:  Severe sharp umbilical and bilateral flank pain with diarrhea and hematochezia. EXAM: CT ABDOMEN AND PELVIS WITH CONTRAST TECHNIQUE: Multidetector CT imaging of the abdomen and pelvis was performed using the standard protocol following bolus administration of intravenous contrast. CONTRAST:  100mL ISOVUE-300 IOPAMIDOL (ISOVUE-300) INJECTION 61% COMPARISON:  03/15/2017 FINDINGS: Lower chest: Borderline cardiomegaly without pericardial effusion. There is atelectasis and/or scarring at the lung bases. Hepatobiliary: Hepatic steatosis without space-occupying mass. Status post cholecystectomy. Pancreas: Unremarkable. No pancreatic ductal dilatation or surrounding inflammatory changes. Spleen: Normal in size without focal abnormality. Adrenals/Urinary Tract: Stable 7 mm exophytic left lower pole renal cyst. No solid enhancing masses of either kidney. Normal bilateral adrenal glands. No nephrolithiasis nor obstructive uropathy. The urinary bladder is physiologically distended. Stomach/Bowel: The stomach is nondistended. There is normal small bowel rotation with the ligament of Treitz position. No small bowel dilatation or obstruction. Status post appendectomy. There is descending and sigmoid diverticulosis without acute diverticulitis. No evidence of acute colitis. No annular constricting lesions. Right-sided cecal diverticulum is also seen. Vascular/Lymphatic: Minimal aortic atherosclerosis. No aneurysm or dissection. No  lymphadenopathy. Stable 8 mm retroperitoneal lymph node adjacent to the left adrenal gland. Reproductive: Status post hysterectomy. No adnexal masses. Other: No abdominal wall hernia or abnormality. No abdominopelvic ascites. Musculoskeletal: No acute or significant osseous findings. IMPRESSION: 1. Colonic diverticulosis without evidence of acute diverticulitis. No bowel inflammation or obstruction. 2. Hepatic steatosis.  Status post cholecystectomy. 3. Stable 7 mm exophytic left lower pole renal cyst. Electronically Signed   By: Tollie Ethavid  Kwon M.D.   On: 07/07/2017 22:13   Impression/plan:  56 year old female with several month history of nonspecific upper abdominal pain initially  lasting for several weeks but then became more intermittent.  More recently had several episodes of moderate volume hematochezia with slight drop in hemoglobin while in the ED.  She has refractory GERD on twice daily PPI.  Recent diagnosis of B12 deficiency without clear etiology.  Cannot rule out complicated GERD, peptic ulcer disease.  Need to exclude celiac disease.  She may have recently had a diverticular bleed given clinical scenario.  Will update CBC.  Plan on EGD and colonoscopy in the near future with deep sedation in the OR given polypharmacy.risks

## 2017-07-13 NOTE — Patient Instructions (Signed)
1. Colonoscopy and upper endoscopy as scheduled.  See separate instructions. 2. Please have your blood work done so that we can have results back prior to your procedures.

## 2017-07-15 LAB — IGA: IGA/IMMUNOGLOBULIN A, SERUM: 311 mg/dL (ref 87–352)

## 2017-07-15 LAB — CBC WITH DIFFERENTIAL/PLATELET
BASOS ABS: 0 10*3/uL (ref 0.0–0.2)
Basos: 0 %
EOS (ABSOLUTE): 0.2 10*3/uL (ref 0.0–0.4)
Eos: 2 %
Hematocrit: 37.9 % (ref 34.0–46.6)
Hemoglobin: 12.3 g/dL (ref 11.1–15.9)
IMMATURE GRANS (ABS): 0 10*3/uL (ref 0.0–0.1)
IMMATURE GRANULOCYTES: 1 %
LYMPHS: 36 %
Lymphocytes Absolute: 3.1 10*3/uL (ref 0.7–3.1)
MCH: 26.8 pg (ref 26.6–33.0)
MCHC: 32.5 g/dL (ref 31.5–35.7)
MCV: 83 fL (ref 79–97)
MONOS ABS: 0.5 10*3/uL (ref 0.1–0.9)
Monocytes: 6 %
NEUTROS PCT: 55 %
Neutrophils Absolute: 4.8 10*3/uL (ref 1.4–7.0)
PLATELETS: 275 10*3/uL (ref 150–379)
RBC: 4.59 x10E6/uL (ref 3.77–5.28)
RDW: 14.9 % (ref 12.3–15.4)
WBC: 8.7 10*3/uL (ref 3.4–10.8)

## 2017-07-15 LAB — TISSUE TRANSGLUTAMINASE, IGA

## 2017-07-15 NOTE — Patient Instructions (Signed)
Lunette StandsSherry L Bonfanti  07/15/2017     @PREFPERIOPPHARMACY @   Your procedure is scheduled on  07/19/2017   Report to [facility] at  830   A.M.  Call this number if you have problems the morning of surgery:  [phone]   Remember:  Do not eat food or drink liquids after midnight.  Take these medicines the morning of surgery with A SIP OF WATER  prilosec.   Do not wear jewelry, make-up or nail polish.  Do not wear lotions, powders, or perfumes, or deoderant.  Do not shave 48 hours prior to surgery.  Men may shave face and neck.  Do not bring valuables to the hospital.  [facility] or [facility]uables.  Contacts, dentures or bridgework may not be worn into surgery.  Leave your suitcase in the car.  After surgery it may be brought to your room.  For patients admitted to the hospital, discharge time will be determined by your treatment team.  Patients discharged the day of surgery will not be allowed to drive home.   Name and phone number of your driver:   family Special instructions:  Follow the diet and prep instructions given to you by Dr Luvenia Starchourk's office.  Please read over the following fact sheets that you were given. Anesthesia Post-op Instructions and Care and Recovery After Surgery       Esophagogastroduodenoscopy Esophagogastroduodenoscopy (EGD) is a procedure to examine the lining of the esophagus, stomach, and first part of the small intestine (duodenum). This procedure is done to check for problems such as inflammation, bleeding, ulcers, or growths. During this procedure, a long, flexible, lighted tube with a camera attached (endoscope) is inserted down the throat. Tell a health care provider about:  Any allergies you have.  All medicines you are taking, including vitamins, herbs, eye drops, creams, and over-the-counter medicines.  Any problems you or family members have had with anesthetic medicines.  Any blood  disorders you have.  Any surgeries you have had.  Any medical conditions you have.  Whether you are pregnant or may be pregnant. What are the risks? Generally, this is a safe procedure. However, problems may occur, including:  Infection.  Bleeding.  A tear (perforation) in the esophagus, stomach, or duodenum.  Trouble breathing.  Excessive sweating.  Spasms of the larynx.  A slowed heartbeat.  Low blood pressure.  What happens before the procedure?  Follow instructions from your health care provider about eating or drinking restrictions.  Ask your health care provider about: ? Changing or stopping your regular medicines. This is especially important if you are taking diabetes medicines or blood thinners. ? Taking medicines such as aspirin and ibuprofen. These medicines can thin your blood. Do not take these medicines before your procedure if your health care provider instructs you not to.  Plan to have someone take you home after the procedure.  If you wear dentures, be ready to remove them before the procedure. What happens during the procedure?  To reduce your risk of infection, your health care team will wash or sanitize their hands.  An IV tube will be put in a vein in your hand or arm. You will get medicines and fluids through this tube.  You will be given one or more of the following: ? A medicine to help you relax (sedative). ? A medicine to numb the area (local anesthetic). This medicine may  be sprayed into your throat. It will make you feel more comfortable and keep you from gagging or coughing during the procedure. ? A medicine for pain.  A mouth guard may be placed in your mouth to protect your teeth and to keep you from biting on the endoscope.  You will be asked to lie on your left side.  The endoscope will be lowered down your throat into your esophagus, stomach, and duodenum.  Air will be put into the endoscope. This will help your health care  provider see better.  The lining of your esophagus, stomach, and duodenum will be examined.  Your health care provider may: ? Take a tissue sample so it can be looked at in a lab (biopsy). ? Remove growths. ? Remove objects (foreign bodies) that are stuck. ? Treat any bleeding with medicines or other devices that stop tissue from bleeding. ? Widen (dilate) or stretch narrowed areas of your esophagus and stomach.  The endoscope will be taken out. The procedure may vary among health care providers and hospitals. What happens after the procedure?  Your blood pressure, heart rate, breathing rate, and blood oxygen level will be monitored often until the medicines you were given have worn off.  Do not eat or drink anything until the numbing medicine has worn off and your gag reflex has returned. This information is not intended to replace advice given to you by your health care provider. Make sure you discuss any questions you have with your health care provider. Document Released: 12/18/2004 Document Revised: 01/23/2016 Document Reviewed: 07/11/2015 Elsevier Interactive Patient Education  2018 [employer]. Esophagogastroduodenoscopy, Care After Refer to this sheet in the next few weeks. These instructions provide you with information about caring for yourself after your procedure. Your health care provider may also give you more specific instructions. Your treatment has been planned according to current medical practices, but problems sometimes occur. Call your health care provider if you have any problems or questions after your procedure. What can I expect after the procedure? After the procedure, it is common to have:  A sore throat.  Nausea.  Bloating.  Dizziness.  Fatigue.  Follow these instructions at home:  Do not eat or drink anything until the numbing medicine (local anesthetic) has worn off and your gag reflex has returned. You will know that the local anesthetic has worn  off when you can swallow comfortably.  Do not drive for 24 hours if you received a medicine to help you relax (sedative).  If your health care provider took a tissue sample for testing during the procedure, make sure to get your test results. This is your responsibility. Ask your health care provider or the department performing the test when your results will be ready.  Keep all follow-up visits as told by your health care provider. This is important. Contact a health care provider if:  You cannot stop coughing.  You are not urinating.  You are urinating less than usual. Get help right away if:  You have trouble swallowing.  You cannot eat or drink.  You have throat or chest pain that gets worse.  You are dizzy or light-headed.  You faint.  You have nausea or vomiting.  You have chills.  You have a fever.  You have severe abdominal pain.  You have black, tarry, or bloody stools. This information is not intended to replace advice given to you by your health care provider. Make sure you discuss any questions you have with  your health care provider. Document Released: 08/03/2012 Document Revised: 01/23/2016 Document Reviewed: 07/11/2015 Elsevier Interactive Patient Education  2018 [employer].  Colonoscopy, Adult A colonoscopy is an exam to look at the entire large intestine. During the exam, a lubricated, bendable tube is inserted into the anus and then passed into the rectum, colon, and other parts of the large intestine. A colonoscopy is often done as a part of normal colorectal screening or in response to certain symptoms, such as anemia, persistent diarrhea, abdominal pain, and blood in the stool. The exam can help screen for and diagnose medical problems, including:  Tumors.  Polyps.  Inflammation.  Areas of bleeding.  Tell a health care provider about:  Any allergies you have.  All medicines you are taking, including vitamins, herbs, eye drops, creams,  and over-the-counter medicines.  Any problems you or family members have had with anesthetic medicines.  Any blood disorders you have.  Any surgeries you have had.  Any medical conditions you have.  Any problems you have had passing stool. What are the risks? Generally, this is a safe procedure. However, problems may occur, including:  Bleeding.  A tear in the intestine.  A reaction to medicines given during the exam.  Infection (rare).  What happens before the procedure? Eating and drinking restrictions Follow instructions from your health care provider about eating and drinking, which may include:  A few days before the procedure - follow a low-fiber diet. Avoid nuts, seeds, dried fruit, raw fruits, and vegetables.  1-3 days before the procedure - follow a clear liquid diet. Drink only clear liquids, such as clear broth or bouillon, black coffee or tea, clear juice, clear soft drinks or sports drinks, gelatin dessert, and popsicles. Avoid any liquids that contain red or purple dye.  On the day of the procedure - do not eat or drink anything during the 2 hours before the procedure, or within the time period that your health care provider recommends.  Bowel prep If you were prescribed an oral bowel prep to clean out your colon:  Take it as told by your health care provider. Starting the day before your procedure, you will need to drink a large amount of medicated liquid. The liquid will cause you to have multiple loose stools until your stool is almost clear or light green.  If your skin or anus gets irritated from diarrhea, you may use these to relieve the irritation: ? Medicated wipes, such as adult wet wipes with aloe and vitamin E. ? A skin soothing-product like petroleum jelly.  If you vomit while drinking the bowel prep, take a break for up to 60 minutes and then begin the bowel prep again. If vomiting continues and you cannot take the bowel prep without vomiting, call  your health care provider.  General instructions  Ask your health care provider about changing or stopping your regular medicines. This is especially important if you are taking diabetes medicines or blood thinners.  Plan to have someone take you home from the hospital or clinic. What happens during the procedure?  An IV tube may be inserted into one of your veins.  You will be given medicine to help you relax (sedative).  To reduce your risk of infection: ? Your health care team will wash or sanitize their hands. ? Your anal area will be washed with soap.  You will be asked to lie on your side with your knees bent.  Your health care provider will lubricate a long,  thin, flexible tube. The tube will have a camera and a light on the end.  The tube will be inserted into your anus.  The tube will be gently eased through your rectum and colon.  Air will be delivered into your colon to keep it open. You may feel some pressure or cramping.  The camera will be used to take images during the procedure.  A small tissue sample may be removed from your body to be examined under a microscope (biopsy). If any potential problems are found, the tissue will be sent to a lab for testing.  If small polyps are found, your health care provider may remove them and have them checked for cancer cells.  The tube that was inserted into your anus will be slowly removed. The procedure may vary among health care providers and hospitals. What happens after the procedure?  Your blood pressure, heart rate, breathing rate, and blood oxygen level will be monitored until the medicines you were given have worn off.  Do not drive for 24 hours after the exam.  You may have a small amount of blood in your stool.  You may pass gas and have mild abdominal cramping or bloating due to the air that was used to inflate your colon during the exam.  It is up to you to get the results of your procedure. Ask your  health care provider, or the department performing the procedure, when your results will be ready. This information is not intended to replace advice given to you by your health care provider. Make sure you discuss any questions you have with your health care provider. Document Released: 08/14/2000 Document Revised: 06/17/2016 Document Reviewed: 10/29/2015 Elsevier Interactive Patient Education  2018 [employer].  Colonoscopy, Adult, Care After This sheet gives you information about how to care for yourself after your procedure. Your health care provider may also give you more specific instructions. If you have problems or questions, contact your health care provider. What can I expect after the procedure? After the procedure, it is common to have:  A small amount of blood in your stool for 24 hours after the procedure.  Some gas.  Mild abdominal cramping or bloating.  Follow these instructions at home: General instructions   For the first 24 hours after the procedure: ? Do not drive or use machinery. ? Do not sign important documents. ? Do not drink alcohol. ? Do your regular daily activities at a slower pace than normal. ? Eat soft, easy-to-digest foods. ? Rest often.  Take over-the-counter or prescription medicines only as told by your health care provider.  It is up to you to get the results of your procedure. Ask your health care provider, or the department performing the procedure, when your results will be ready. Relieving cramping and bloating  Try walking around when you have cramps or feel bloated.  Apply heat to your abdomen as told by your health care provider. Use a heat source that your health care provider recommends, such as a moist heat pack or a heating pad. ? Place a towel between your skin and the heat source. ? Leave the heat on for 20-30 minutes. ? Remove the heat if your skin turns bright red. This is especially important if you are unable to feel pain,  heat, or cold. You may have a greater risk of getting burned. Eating and drinking  Drink enough fluid to keep your urine clear or pale yellow.  Resume your normal diet as  instructed by your health care provider. Avoid heavy or fried foods that are hard to digest.  Avoid drinking alcohol for as long as instructed by your health care provider. Contact a health care provider if:  You have blood in your stool 2-3 days after the procedure. Get help right away if:  You have more than a small spotting of blood in your stool.  You pass large blood clots in your stool.  Your abdomen is swollen.  You have nausea or vomiting.  You have a fever.  You have increasing abdominal pain that is not relieved with medicine. This information is not intended to replace advice given to you by your health care provider. Make sure you discuss any questions you have with your health care provider. Document Released: 03/31/2004 Document Revised: 05/11/2016 Document Reviewed: 10/29/2015 Elsevier Interactive Patient Education  2018 [employer] Monitored Anesthesia Care Anesthesia is a term that refers to techniques, procedures, and medicines that help a person stay safe and comfortable during a medical procedure. Monitored anesthesia care, or sedation, is one type of anesthesia. Your anesthesia specialist may recommend sedation if you will be having a procedure that does not require you to be unconscious, such as:  Cataract surgery.  A dental procedure.  A biopsy.  A colonoscopy.  During the procedure, you may receive a medicine to help you relax (sedative). There are three levels of sedation:  Mild sedation. At this level, you may feel awake and relaxed. You will be able to follow directions.  Moderate sedation. At this level, you will be sleepy. You may not remember the procedure.  Deep sedation. At this level, you will be asleep. You will not remember the procedure.  The more medicine you are  given, the deeper your level of sedation will be. Depending on how you respond to the procedure, the anesthesia specialist may change your level of sedation or the type of anesthesia to fit your needs. An anesthesia specialist will monitor you closely during the procedure. Let your health care provider know about:  Any allergies you have.  All medicines you are taking, including vitamins, herbs, eye drops, creams, and over-the-counter medicines.  Any use of steroids (by mouth or as a cream).  Any problems you or family members have had with sedatives and anesthetic medicines.  Any blood disorders you have.  Any surgeries you have had.  Any medical conditions you have, such as sleep apnea.  Whether you are pregnant or may be pregnant.  Any use of cigarettes, alcohol, or street drugs. What are the risks? Generally, this is a safe procedure. However, problems may occur, including:  Getting too much medicine (oversedation).  Nausea.  Allergic reaction to medicines.  Trouble breathing. If this happens, a breathing tube may be used to help with breathing. It will be removed when you are awake and breathing on your own.  Heart trouble.  Lung trouble.  Before the procedure Staying hydrated Follow instructions from your health care provider about hydration, which may include:  Up to 2 hours before the procedure - you may continue to drink clear liquids, such as water, clear fruit juice, black coffee, and plain tea.  Eating and drinking restrictions Follow instructions from your health care provider about eating and drinking, which may include:  8 hours before the procedure - stop eating heavy meals or foods such as meat, fried foods, or fatty foods.  6 hours before the procedure - stop eating light meals or foods, such as toast  or cereal.  6 hours before the procedure - stop drinking milk or drinks that contain milk.  2 hours before the procedure - stop drinking clear  liquids.  Medicines Ask your health care provider about:  Changing or stopping your regular medicines. This is especially important if you are taking diabetes medicines or blood thinners.  Taking medicines such as aspirin and ibuprofen. These medicines can thin your blood. Do not take these medicines before your procedure if your health care provider instructs you not to.  Tests and exams  You will have a physical exam.  You may have blood tests done to show: ? How well your kidneys and liver are working. ? How well your blood can clot.  General instructions  Plan to have someone take you home from the hospital or clinic.  If you will be going home right after the procedure, plan to have someone with you for 24 hours.  What happens during the procedure?  Your blood pressure, heart rate, breathing, level of pain and overall condition will be monitored.  An IV tube will be inserted into one of your veins.  Your anesthesia specialist will give you medicines as needed to keep you comfortable during the procedure. This may mean changing the level of sedation.  The procedure will be performed. After the procedure  Your blood pressure, heart rate, breathing rate, and blood oxygen level will be monitored until the medicines you were given have worn off.  Do not drive for 24 hours if you received a sedative.  You may: ? Feel sleepy, clumsy, or nauseous. ? Feel forgetful about what happened after the procedure. ? Have a sore throat if you had a breathing tube during the procedure. ? Vomit. This information is not intended to replace advice given to you by your health care provider. Make sure you discuss any questions you have with your health care provider. Document Released: 05/13/2005 Document Revised: 01/24/2016 Document Reviewed: 12/08/2015 Elsevier Interactive Patient Education  [employer]2018 [employer]

## 2017-07-15 NOTE — Progress Notes (Signed)
cc'ed to pcp °

## 2017-07-16 ENCOUNTER — Encounter (HOSPITAL_COMMUNITY)
Admission: RE | Admit: 2017-07-16 | Discharge: 2017-07-16 | Disposition: A | Discharge status: Home / Self Care | Payer: BLUE CROSS/BLUE SHIELD | Source: Ambulatory Visit | Attending: Internal Medicine | Admitting: Internal Medicine

## 2017-07-16 ENCOUNTER — Encounter (HOSPITAL_COMMUNITY): Payer: Self-pay

## 2017-07-16 ENCOUNTER — Other Ambulatory Visit (HOSPITAL_COMMUNITY): Payer: BLUE CROSS/BLUE SHIELD

## 2017-07-16 ENCOUNTER — Other Ambulatory Visit: Payer: Self-pay

## 2017-07-16 HISTORY — DX: Anemia, unspecified: D64.9

## 2017-07-19 ENCOUNTER — Ambulatory Visit (HOSPITAL_COMMUNITY): Payer: BLUE CROSS/BLUE SHIELD | Admitting: Anesthesiology

## 2017-07-19 ENCOUNTER — Encounter (HOSPITAL_COMMUNITY): Payer: Self-pay | Admitting: *Deleted

## 2017-07-19 ENCOUNTER — Encounter (HOSPITAL_COMMUNITY)
Admission: RE | Disposition: A | Discharge status: Home / Self Care | Payer: Self-pay | Source: Ambulatory Visit | Attending: Internal Medicine

## 2017-07-19 ENCOUNTER — Telehealth: Payer: Self-pay

## 2017-07-19 ENCOUNTER — Ambulatory Visit (HOSPITAL_COMMUNITY)
Admission: RE | Admit: 2017-07-19 | Discharge: 2017-07-19 | Disposition: A | Discharge status: Home / Self Care | Payer: BLUE CROSS/BLUE SHIELD | Source: Ambulatory Visit | Attending: Internal Medicine | Admitting: Internal Medicine

## 2017-07-19 DIAGNOSIS — K921 Melena: Secondary | ICD-10-CM | POA: Diagnosis not present

## 2017-07-19 DIAGNOSIS — K449 Diaphragmatic hernia without obstruction or gangrene: Secondary | ICD-10-CM | POA: Diagnosis not present

## 2017-07-19 DIAGNOSIS — K5730 Diverticulosis of large intestine without perforation or abscess without bleeding: Secondary | ICD-10-CM | POA: Insufficient documentation

## 2017-07-19 DIAGNOSIS — F319 Bipolar disorder, unspecified: Secondary | ICD-10-CM | POA: Diagnosis not present

## 2017-07-19 DIAGNOSIS — K210 Gastro-esophageal reflux disease with esophagitis: Secondary | ICD-10-CM | POA: Diagnosis not present

## 2017-07-19 DIAGNOSIS — K640 First degree hemorrhoids: Secondary | ICD-10-CM

## 2017-07-19 DIAGNOSIS — D649 Anemia, unspecified: Secondary | ICD-10-CM | POA: Insufficient documentation

## 2017-07-19 DIAGNOSIS — E538 Deficiency of other specified B group vitamins: Secondary | ICD-10-CM | POA: Insufficient documentation

## 2017-07-19 DIAGNOSIS — K219 Gastro-esophageal reflux disease without esophagitis: Secondary | ICD-10-CM | POA: Diagnosis not present

## 2017-07-19 DIAGNOSIS — F17210 Nicotine dependence, cigarettes, uncomplicated: Secondary | ICD-10-CM | POA: Insufficient documentation

## 2017-07-19 DIAGNOSIS — K625 Hemorrhage of anus and rectum: Secondary | ICD-10-CM | POA: Diagnosis not present

## 2017-07-19 DIAGNOSIS — E7800 Pure hypercholesterolemia, unspecified: Secondary | ICD-10-CM | POA: Diagnosis not present

## 2017-07-19 DIAGNOSIS — Z79899 Other long term (current) drug therapy: Secondary | ICD-10-CM | POA: Diagnosis not present

## 2017-07-19 DIAGNOSIS — R1013 Epigastric pain: Secondary | ICD-10-CM

## 2017-07-19 HISTORY — PX: ESOPHAGOGASTRODUODENOSCOPY (EGD) WITH PROPOFOL: SHX5813

## 2017-07-19 HISTORY — PX: COLONOSCOPY WITH PROPOFOL: SHX5780

## 2017-07-19 SURGERY — COLONOSCOPY WITH PROPOFOL
Anesthesia: Monitor Anesthesia Care

## 2017-07-19 MED ORDER — LACTATED RINGERS IV SOLN
INTRAVENOUS | Status: DC
  Administered 2017-07-19: 1000 mL via INTRAVENOUS

## 2017-07-19 MED ORDER — MIDAZOLAM HCL 2 MG/2ML IJ SOLN
INTRAMUSCULAR | Status: AC
  Filled 2017-07-19: qty 2

## 2017-07-19 MED ORDER — PROPOFOL 10 MG/ML IV BOLUS
INTRAVENOUS | Status: DC | PRN
  Administered 2017-07-19 (×2): 20 mg via INTRAVENOUS

## 2017-07-19 MED ORDER — FENTANYL CITRATE (PF) 100 MCG/2ML IJ SOLN
INTRAMUSCULAR | Status: AC
  Filled 2017-07-19: qty 2

## 2017-07-19 MED ORDER — MIDAZOLAM HCL 2 MG/2ML IJ SOLN
1.0000 mg | INTRAMUSCULAR | Status: AC
  Administered 2017-07-19: 2 mg via INTRAVENOUS

## 2017-07-19 MED ORDER — CHLORHEXIDINE GLUCONATE CLOTH 2 % EX PADS: 6.0000 | MEDICATED_PAD | Freq: Once | CUTANEOUS | Status: DC

## 2017-07-19 MED ORDER — FENTANYL CITRATE (PF) 100 MCG/2ML IJ SOLN
25.0000 ug | Freq: Once | INTRAMUSCULAR | Status: AC
  Administered 2017-07-19: 25 ug via INTRAVENOUS

## 2017-07-19 MED ORDER — PROPOFOL 500 MG/50ML IV EMUL
INTRAVENOUS | Status: DC | PRN
  Administered 2017-07-19: 75 ug/kg/min via INTRAVENOUS
  Administered 2017-07-19: 125 ug/kg/min via INTRAVENOUS

## 2017-07-19 MED ORDER — LIDOCAINE VISCOUS 2 % MT SOLN
3.0000 mL | OROMUCOSAL | Status: AC | PRN
  Administered 2017-07-19 (×2): 3 mL via OROMUCOSAL

## 2017-07-19 MED ORDER — LIDOCAINE VISCOUS 2 % MT SOLN
OROMUCOSAL | Status: AC
  Filled 2017-07-19: qty 15

## 2017-07-19 NOTE — Discharge Instructions (Addendum)
°Colonoscopy °Discharge Instructions ° °Read the instructions outlined below and refer to this sheet in the next few weeks. These discharge instructions provide you with general information on caring for yourself after you leave the hospital. Your doctor may also give you specific instructions. While your treatment has been planned according to the most current medical practices available, unavoidable complications occasionally occur. If you have any problems or questions after discharge, call Dr. Rourk at [phone]. °ACTIVITY °· You may resume your regular activity, but move at a slower pace for the next 24 hours.  °· Take frequent rest periods for the next 24 hours.  °· Walking will help get rid of the air and reduce the bloated feeling in your belly (abdomen).  °· No driving for 24 hours (because of the medicine (anesthesia) used during the test).   °· Do not sign any important legal documents or operate any machinery for 24 hours (because of the anesthesia used during the test).  °NUTRITION °· Drink plenty of fluids.  °· You may resume your normal diet as instructed by your doctor.  °· Begin with a light meal and progress to your normal diet. Heavy or fried foods are harder to digest and may make you feel sick to your stomach (nauseated).  °· Avoid alcoholic beverages for 24 hours or as instructed.  °MEDICATIONS °· You may resume your normal medications unless your doctor tells you otherwise.  °WHAT YOU CAN EXPECT TODAY °· Some feelings of bloating in the abdomen.  °· Passage of more gas than usual.  °· Spotting of blood in your stool or on the toilet paper.  °IF YOU HAD POLYPS REMOVED DURING THE COLONOSCOPY: °· No aspirin products for 7 days or as instructed.  °· No alcohol for 7 days or as instructed.  °· Eat a soft diet for the next 24 hours.  °FINDING OUT THE RESULTS OF YOUR TEST °Not all test results are available during your visit. If your test results are not back during the visit, make an appointment  with your caregiver to find out the results. Do not assume everything is normal if you have not heard from your caregiver or the medical facility. It is important for you to follow up on all of your test results.  °SEEK IMMEDIATE MEDICAL ATTENTION IF: °· You have more than a spotting of blood in your stool.  °· Your belly is swollen (abdominal distention).  °· You are nauseated or vomiting.  °· You have a temperature over 101.  °· You have abdominal pain or discomfort that is severe or gets worse throughout the day.  °EGD °Discharge instructions °Please read the instructions outlined below and refer to this sheet in the next few weeks. These discharge instructions provide you with general information on caring for yourself after you leave the hospital. Your doctor may also give you specific instructions. While your treatment has been planned according to the most current medical practices available, unavoidable complications occasionally occur. If you have any problems or questions after discharge, please call your doctor. °ACTIVITY °· You may resume your regular activity but move at a slower pace for the next 24 hours.  °· Take frequent rest periods for the next 24 hours.  °· Walking will help expel (get rid of) the air and reduce the bloated feeling in your abdomen.  °· No driving for 24 hours (because of the anesthesia (medicine) used during the test).  °· You may shower.  °· Do not sign any important   legal documents or operate any machinery for 24 hours (because of the anesthesia used during the test).  NUTRITION  Drink plenty of fluids.   You may resume your normal diet.   Begin with a light meal and progress to your normal diet.   Avoid alcoholic beverages for 24 hours or as instructed by your caregiver.  MEDICATIONS  You may resume your normal medications unless your caregiver tells you otherwise.  WHAT YOU CAN EXPECT TODAY  You may experience abdominal discomfort such as a feeling of fullness  or gas pains.  FOLLOW-UP  Your doctor will discuss the results of your test with you.  SEEK IMMEDIATE MEDICAL ATTENTION IF ANY OF THE FOLLOWING OCCUR:  Excessive nausea (feeling sick to your stomach) and/or vomiting.   Severe abdominal pain and distention (swelling).   Trouble swallowing.   Temperature over 101 F (37.8 C).   Rectal bleeding or vomiting of blood.   GERD, diverticulosis and hemorrhoid information provided  Stop omeprazole; Trial of  Dexilant 60 mg daily 3 weeks-go by my office for a 3 week supply of samples.  Office visit with us in 2 months.  Repeat colonoscopy in 10 years for screening purposes.    Gastroesophageal Reflux Disease, Adult Normally, food travels down the esophagus and stays in the stomach to be digested. However, when a person has gastroesophageal reflux disease (GERD), food and stomach acid move back up into the esophagus. When this happens, the esophagus becomes sore and inflamed. Over time, GERD can create small holes (ulcers) in the lining of the esophagus. What are the causes? This condition is caused by a problem with the muscle between the esophagus and the stomach (lower esophageal sphincter, or LES). Normally, the LES muscle closes after food passes through the esophagus to the stomach. When the LES is weakened or abnormal, it does not close properly, and that allows food and stomach acid to go back up into the esophagus. The LES can be weakened by certain dietary substances, medicines, and medical conditions, including:  Tobacco use.  Pregnancy.  Having a hiatal hernia.  Heavy alcohol use.  Certain foods and beverages, such as coffee, chocolate, onions, and peppermint.  What increases the risk? This condition is more likely to develop in:  People who have an increased body weight.  People who have connective tissue disorders.  People who use NSAID medicines.  What are the signs or symptoms? Symptoms of this condition  include:  Heartburn.  Difficult or painful swallowing.  The feeling of having a lump in the throat.  Abitter taste in the mouth.  Bad breath.  Having a large amount of saliva.  Having an upset or bloated stomach.  Belching.  Chest pain.  Shortness of breath or wheezing.  Ongoing (chronic) cough or a night-time cough.  Wearing away of tooth enamel.  Weight loss.  Different conditions can cause chest pain. Make sure to see your health care provider if you experience chest pain. How is this diagnosed? Your health care provider will take a medical history and perform a physical exam. To determine if you have mild or severe GERD, your health care provider may also monitor how you respond to treatment. You may also have other tests, including:  An endoscopy toexamine your stomach and esophagus with a small camera.  A test thatmeasures the acidity level in your esophagus.  A test thatmeasures how much pressure is on your esophagus.  A barium swallow or modified barium swallow to show the shape,  size, and functioning of your esophagus.  How is this treated? The goal of treatment is to help relieve your symptoms and to prevent complications. Treatment for this condition may vary depending on how severe your symptoms are. Your health care provider may recommend:  Changes to your diet.  Medicine.  Surgery.  Follow these instructions at home: Diet  Follow a diet as recommended by your health care provider. This may involve avoiding foods and drinks such as: ? Coffee and tea (with or without caffeine). ? Drinks that containalcohol. ? Energy drinks and sports drinks. ? Carbonated drinks or sodas. ? Chocolate and cocoa. ? Peppermint and mint flavorings. ? Garlic and onions. ? Horseradish. ? Spicy and acidic foods, including peppers, chili powder, curry powder, vinegar, hot sauces, and barbecue sauce. ? Citrus fruit juices and citrus fruits, such as oranges, lemons,  and limes. ? Tomato-based foods, such as red sauce, chili, salsa, and pizza with red sauce. ? Fried and fatty foods, such as donuts, french fries, potato chips, and high-fat dressings. ? High-fat meats, such as hot dogs and fatty cuts of red and white meats, such as rib eye steak, sausage, ham, and bacon. ? High-fat dairy items, such as whole milk, butter, and cream cheese.  Eat small, frequent meals instead of large meals.  Avoid drinking large amounts of liquid with your meals.  Avoid eating meals during the 2-3 hours before bedtime.  Avoid lying down right after you eat.  Do not exercise right after you eat. General instructions  Pay attention to any changes in your symptoms.  Take over-the-counter and prescription medicines only as told by your health care provider. Do not take aspirin, ibuprofen, or other NSAIDs unless your health care provider told you to do so.  Do not use any tobacco products, including cigarettes, chewing tobacco, and e-cigarettes. If you need help quitting, ask your health care provider.  Wear loose-fitting clothing. Do not wear anything tight around your waist that causes pressure on your abdomen.  Raise (elevate) the head of your bed 6 inches (15cm).  Try to reduce your stress, such as with yoga or meditation. If you need help reducing stress, ask your health care provider.  If you are overweight, reduce your weight to an amount that is healthy for you. Ask your health care provider for guidance about a safe weight loss goal.  Keep all follow-up visits as told by your health care provider. This is important. Contact a health care provider if:  You have new symptoms.  You have unexplained weight loss.  You have difficulty swallowing, or it hurts to swallow.  You have wheezing or a persistent cough.  Your symptoms do not improve with treatment.  You have a hoarse voice. Get help right away if:  You have pain in your arms, neck, jaw, teeth, or  back.  You feel sweaty, dizzy, or light-headed.  You have chest pain or shortness of breath.  You vomit and your vomit looks like blood or coffee grounds.  You faint.  Your stool is bloody or black.  You cannot swallow, drink, or eat.   Diverticulosis Diverticulosis is a condition that develops when small pouches (diverticula) form in the wall of the large intestine (colon). The colon is where water is absorbed and stool is formed. The pouches form when the inside layer of the colon pushes through weak spots in the outer layers of the colon. You may have a few pouches or many of them. What are the  causes? The cause of this condition is not known. What increases the risk? The following factors may make you more likely to develop this condition:  Being older than age 41. Your risk for this condition increases with age. Diverticulosis is rare among people younger than age 50. By age 85, many people have it.  Eating a low-fiber diet.  Having frequent constipation.  Being overweight.  Not getting enough exercise.  Smoking.  Taking over-the-counter pain medicines, like aspirin and ibuprofen.  Having a family history of diverticulosis.  What are the signs or symptoms? In most people, there are no symptoms of this condition. If you do have symptoms, they may include:  Bloating.  Cramps in the abdomen.  Constipation or diarrhea.  Pain in the lower left side of the abdomen.  How is this diagnosed? This condition is most often diagnosed during an exam for other colon problems. Because diverticulosis usually has no symptoms, it often cannot be diagnosed independently. This condition may be diagnosed by:  Using a flexible scope to examine the colon (colonoscopy).  Taking an X-ray of the colon after dye has been put into the colon (barium enema).  Doing a CT scan.  How is this treated? You may not need treatment for this condition if you have never developed an infection  related to diverticulosis. If you have had an infection before, treatment may include:  Eating a high-fiber diet. This may include eating more fruits, vegetables, and grains.  Taking a fiber supplement.  Taking a live bacteria supplement (probiotic).  Taking medicine to relax your colon.  Taking antibiotic medicines.  Follow these instructions at home:  Drink 6-8 glasses of water or more each day to prevent constipation.  Try not to strain when you have a bowel movement.  If you have had an infection before: ? Eat more fiber as directed by your health care provider or your diet and nutrition specialist (dietitian). ? Take a fiber supplement or probiotic, if your health care provider approves.  Take over-the-counter and prescription medicines only as told by your health care provider.  If you were prescribed an antibiotic, take it as told by your health care provider. Do not stop taking the antibiotic even if you start to feel better.  Keep all follow-up visits as told by your health care provider. This is important. Contact a health care provider if:  You have pain in your abdomen.  You have bloating.  You have cramps.  You have not had a bowel movement in 3 days. Get help right away if:  Your pain gets worse.  Your bloating becomes very bad.  You have a fever or chills, and your symptoms suddenly get worse.  You vomit.  You have bowel movements that are bloody or black.  You have bleeding from your rectum. Summary  Diverticulosis is a condition that develops when small pouches (diverticula) form in the wall of the large intestine (colon).  You may have a few pouches or many of them.  This condition is most often diagnosed during an exam for other colon problems.  If you have had an infection related to diverticulosis, treatment may include increasing the fiber in your diet, taking supplements, or taking medicines.   Hemorrhoids Hemorrhoids are swollen  veins in and around the rectum or anus. There are two types of hemorrhoids:  Internal hemorrhoids. These occur in the veins that are just inside the rectum. They may poke through to the outside and become irritated and painful.  External hemorrhoids. These occur in the veins that are outside of the anus and can be felt as a painful swelling or hard lump near the anus.  Most hemorrhoids do not cause serious problems, and they can be managed with home treatments such as diet and lifestyle changes. If home treatments do not help your symptoms, procedures can be done to shrink or remove the hemorrhoids. What are the causes? This condition is caused by increased pressure in the anal area. This pressure may result from various things, including:  Constipation.  Straining to have a bowel movement.  Diarrhea.  Pregnancy.  Obesity.  Sitting for long periods of time.  Heavy lifting or other activity that causes you to strain.  Anal sex.  What are the signs or symptoms? Symptoms of this condition include:  Pain.  Anal itching or irritation.  Rectal bleeding.  Leakage of stool (feces).  Anal swelling.  One or more lumps around the anus.  How is this diagnosed? This condition can often be diagnosed through a visual exam. Other exams or tests may also be done, such as:  Examination of the rectal area with a gloved hand (digital rectal exam).  Examination of the anal canal using a small tube (anoscope).  A blood test, if you have lost a significant amount of blood.  A test to look inside the colon (sigmoidoscopy or colonoscopy).  How is this treated? This condition can usually be treated at home. However, various procedures may be done if dietary changes, lifestyle changes, and other home treatments do not help your symptoms. These procedures can help make the hemorrhoids smaller or remove them completely. Some of these procedures involve surgery, and others do not. Common  procedures include:  Rubber band ligation. Rubber bands are placed at the base of the hemorrhoids to cut off the blood supply to them.  Sclerotherapy. Medicine is injected into the hemorrhoids to shrink them.  Infrared coagulation. A type of light energy is used to get rid of the hemorrhoids.  Hemorrhoidectomy surgery. The hemorrhoids are surgically removed, and the veins that supply them are tied off.  Stapled hemorrhoidopexy surgery. A circular stapling device is used to remove the hemorrhoids and use staples to cut off the blood supply to them.  Follow these instructions at home: Eating and drinking  Eat foods that have a lot of fiber in them, such as whole grains, beans, nuts, fruits, and vegetables. Ask your health care provider about taking products that have added fiber (fiber supplements).  Drink enough fluid to keep your urine clear or pale yellow. Managing pain and swelling  Take warm sitz baths for 20 minutes, 3-4 times a day to ease pain and discomfort.  If directed, apply ice to the affected area. Using ice packs between sitz baths may be helpful. ? Put ice in a plastic bag. ? Place a towel between your skin and the bag. ? Leave the ice on for 20 minutes, 2-3 times a day. General instructions  Take over-the-counter and prescription medicines only as told by your health care provider.  Use medicated creams or suppositories as told.  Exercise regularly.  Go to the bathroom when you have the urge to have a bowel movement. Do not wait.  Avoid straining to have bowel movements.  Keep the anal area dry and clean. Use wet toilet paper or moist towelettes after a bowel movement.  Do not sit on the toilet for long periods of time. This increases blood pooling and pain. Contact  a health care provider if:  You have increasing pain and swelling that are not controlled by treatment or medicine.  You have uncontrolled bleeding.  You have difficulty having a bowel  movement, or you are unable to have a bowel movement.  You have pain or inflammation outside the area of the hemorrhoids. This information is not intended to replace advice given to you by your health care provider. Make sure you discuss any questions you have with your health care provider. Document Released: 08/14/2000 Document Revised: 01/15/2016 Document Reviewed: 05/01/2015 Elsevier Interactive Patient Education  2017 [employer].

## 2017-07-19 NOTE — Anesthesia Preprocedure Evaluation (Signed)
Anesthesia Evaluation  Patient identified by MRN, date of birth, ID band Patient awake    Reviewed: Allergy & Precautions, NPO status , Patient's Chart, lab work & pertinent test results  Airway Mallampati: II  TM Distance: >3 FB Neck ROM: Full    Dental  (+) Edentulous Upper   Pulmonary Current Smoker,    breath sounds clear to auscultation       Cardiovascular negative cardio ROS   Rhythm:Regular Rate:Normal     Neuro/Psych PSYCHIATRIC DISORDERS Bipolar Disorder negative neurological ROS     GI/Hepatic Neg liver ROS, GERD  ,  Endo/Other  negative endocrine ROS  Renal/GU negative Renal ROS     Musculoskeletal   Abdominal   Peds  Hematology  (+) anemia ,   Anesthesia Other Findings   Reproductive/Obstetrics                             Anesthesia Physical Anesthesia Plan  ASA: II  Anesthesia Plan: MAC   Post-op Pain Management:    Induction: Intravenous  PONV Risk Score and Plan:   Airway Management Planned: Simple Face Mask  Additional Equipment:   Intra-op Plan:   Post-operative Plan:   Informed Consent: I have reviewed the patients History and Physical, chart, labs and discussed the procedure including the risks, benefits and alternatives for the proposed anesthesia with the patient or authorized representative who has indicated his/her understanding and acceptance.     Plan Discussed with:   Anesthesia Plan Comments:         Anesthesia Quick Evaluation

## 2017-07-19 NOTE — Transfer of Care (Signed)
Immediate Anesthesia Transfer of Care Note  Patient: Katie Joyce  Procedure(s) Performed: COLONOSCOPY WITH PROPOFOL (N/A ) ESOPHAGOGASTRODUODENOSCOPY (EGD) WITH PROPOFOL (N/A )  Patient Location: PACU  Anesthesia Type:MAC  Level of Consciousness: awake and alert cooperative  Airway & Oxygen Therapy: Patient Spontanous Breathing  Post-op Assessment: Report given to RN and Post -op Vital signs reviewed and stable  Post vital signs: Reviewed and stable  Last Vitals:  Vitals:   07/19/17 0900 07/19/17 0915  BP: 115/71 117/69  Resp: 20 17  Temp:    SpO2: 93% 94%    Last Pain:  Vitals:   07/19/17 0829  TempSrc: Oral  PainSc: 3       Patients Stated Pain Goal: 5 (29/56/21 3086)  Complications: No apparent anesthesia complications

## 2017-07-19 NOTE — Interval H&P Note (Signed)
History and Physical Interval Note:  07/19/2017 9:26 AM  Katie Joyce  has presented today for surgery, with the diagnosis of rectal bleeding, GERD, abd pain  The various methods of treatment have been discussed with the patient and family. After consideration of risks, benefits and other options for treatment, the patient has consented to  Procedure(s) with comments: COLONOSCOPY WITH PROPOFOL (N/A) - 10:00am ESOPHAGOGASTRODUODENOSCOPY (EGD) WITH PROPOFOL (N/A) as a surgical intervention .  The patient's history has been reviewed, patient examined, no change in status, stable for surgery.  I have reviewed the patient's chart and labs.  Questions were answered to the patient's satisfaction.     Katie Joyce  Patient denies dysphagia. EGD colonoscopy now being done.  The risks, benefits, limitations, imponderables and alternatives regarding both EGD and colonoscopy have been reviewed with the patient. Questions have been answered. All parties agreeable.

## 2017-07-19 NOTE — Op Note (Signed)
[facility] Patient Name: Katie Joyce Procedure Date: 07/19/2017 9:20 AM MRN: [id number] Date of Birth: 1961/07/09 Attending MD: Gennette Pac , MD CSN: [id number] Age: 56 Admit Type: Outpatient Procedure:                Upper GI endoscopy Indications:              Esophageal reflux symptoms that persist despite                            appropriate therapy Providers:                Gennette Pac, MD, Judee Clara, RN,                            Burke Keels, Technician Referring MD:              Medicines:                Propofol per Anesthesia Complications:            No immediate complications. Estimated Blood Loss:     Estimated blood loss: none. Procedure:                Pre-Anesthesia Assessment:                           - Prior to the procedure, a History and Physical                            was performed, and patient medications and                            allergies were reviewed. The patient's tolerance of                            previous anesthesia was also reviewed. The risks                            and benefits of the procedure and the sedation                            options and risks were discussed with the patient.                            All questions were answered, and informed consent                            was obtained. Prior Anticoagulants: The patient has                            taken no previous anticoagulant or antiplatelet                            agents. ASA Grade Assessment: II - A patient with  mild systemic disease. After reviewing the risks                            and benefits, the patient was deemed in                            satisfactory condition to undergo the procedure.                           After obtaining informed consent, the endoscope was                            passed under direct vision. Throughout the                            procedure, the  patient's blood pressure, pulse, and                            oxygen saturations were monitored continuously. The                            [id number]) scope was introduced through the                            and advanced to the second part of duodenum. The                            upper GI endoscopy was accomplished without                            difficulty. The patient tolerated the procedure                            well. Scope In: 9:36:22 AM Scope Out: 9:39:11 AM Total Procedure Duration: 0 hours 2 minutes 49 seconds  Findings:      Distal esophageal erosions within 7 mm of the GE junction found.       Patulous EG junction. No Barrett's epithelium seen.      A medium-sized hiatal hernia was present. Stomach otherwise appeared       normal.      The duodenal bulb, second portion of the duodenum and third portion of       the duodenum were normal. Impression:               - Erosive reflux esophagitis.                           - Medium-sized hiatal hernia.                           - Normal duodenal bulb, second portion of the                            duodenum and third portion of the duodenum.                           -  No specimens collected. Moderate Sedation:      Moderate (conscious) sedation was personally administered by an       anesthesia professional. The following parameters were monitored: oxygen       saturation, heart rate, blood pressure, respiratory rate, EKG, adequacy       of pulmonary ventilation, and response to care. Total physician       intraservice time was 19 minutes. Recommendation:           - Patient has a contact number available for                            emergencies. The signs and symptoms of potential                            delayed complications were discussed with the                            patient. Return to normal activities tomorrow.                            Written discharge instructions were provided to the                             patient.                           - Resume previous diet.                           - Continue present medications. Begin Dexilant 60                            mg daily. Go by office for a 3 week supply of                            samplesait pathology results.                           - No repeat upper endoscopy.                           - Return to GI office in 2 months. Procedure Code(s):        --- Professional ---                           [id number], Esophagogastroduodenoscopy, flexible,                            transoral; diagnostic, including collection of                            specimen(s) by brushing or washing, when performed                            (separate procedure) Diagnosis Code(s):        --- Professional ---  K20.9, Esophagitis, unspecified                           K44.9, Diaphragmatic hernia without obstruction or                            gangrene                           K21.9, Gastro-esophageal reflux disease without                            esophagitis CPT copyright 2016 American [employer] Association. All rights reserved. The codes documented in this report are preliminary and upon coder review may  be revised to meet current compliance requirements. Gerrit Friends. Rourk, MD Gennette Pac, MD 07/19/2017 10:04:21 AM This report has been signed electronically. Number of Addenda: 0

## 2017-07-19 NOTE — Op Note (Signed)
[facility] Patient Name: Katie ProseSherry Frix Procedure Date: 07/19/2017 9:41 AM MRN: [id number] Date of Birth: 09-12-1960 Attending MD: Gennette Pacobert Michael Rein Popov , MD CSN: [id number] Age: 56 Admit Type: Outpatient Procedure:                Colonoscopy Indications:              Hematochezia Providers:                Gennette Pacobert Michael Cherril Hett, MD, Judee ClaraPamela Shreve, RN,                            Burke Keelsrisann Tilley, Technician Referring MD:             [place] Levoarol L. Vincent Medicines:                Propofol per Anesthesia Complications:            No immediate complications. Estimated Blood Loss:     Estimated blood loss: none. Procedure:                Pre-Anesthesia Assessment:                           - Prior to the procedure, a History and Physical                            was performed, and patient medications and                            allergies were reviewed. The patient's tolerance of                            previous anesthesia was also reviewed. The risks                            and benefits of the procedure and the sedation                            options and risks were discussed with the patient.                            All questions were answered, and informed consent                            was obtained. Prior Anticoagulants: The patient has                            taken no previous anticoagulant or antiplatelet                            agents. ASA Grade Assessment: II - A patient with                            mild systemic disease. After reviewing the risks  and benefits, the patient was deemed in                            satisfactory condition to undergo the procedure.                           After obtaining informed consent, the colonoscope                            was passed under direct vision. Throughout the                            procedure, the patient's blood pressure, pulse, and                            oxygen  saturations were monitored continuously. The                            [id number] ([id number]) scope was introduced through                            the and advanced to the the cecum, identified by                            appendiceal orifice and ileocecal valve. The                            colonoscopy was performed without difficulty. The                            patient tolerated the procedure well. The quality                            of the bowel preparation was adequate. The                            ileocecal valve, appendiceal orifice, and rectum                            were photographed. Scope In: 9:45:46 AM Scope Out: 10:00:01 AM Scope Withdrawal Time: 0 hours 9 minutes 22 seconds  Total Procedure Duration: 0 hours 14 minutes 15 seconds  Findings:      The perianal and digital rectal examinations were normal.      Internal hemorrhoids were found during retroflexion. The hemorrhoids       were moderate, medium-sized and Grade I (internal hemorrhoids that do       not prolapse).      Scattered small and large-mouthed diverticula were found in the entire       colon. Estimated blood loss: none.      The exam was otherwise without abnormality on direct and retroflexion       views. Impression:               - Internal hemorrhoids.                           -  Diverticulosis in the entire examined colon.                           - The examination was otherwise normal on direct                            and retroflexion views.                           - No specimens collected. Moderate Sedation:      Moderate (conscious) sedation was personally administered by an       anesthesia professional. The following parameters were monitored: oxygen       saturation, heart rate, blood pressure, respiratory rate, EKG, adequacy       of pulmonary ventilation, and response to care. Total physician       intraservice time was 19 minutes. Recommendation:           - Patient has a  contact number available for                            emergencies. The signs and symptoms of potential                            delayed complications were discussed with the                            patient. Return to normal activities tomorrow.                            Written discharge instructions were provided to the                            patient.                           - Resume previous diet.                           - Continue present medications.                           - Repeat colonoscopy in 10 years for screening                            purposes.                           - Return to GI clinic in 2 months. See EGD report.                            Office visit with Katie Joyce in 2 months. Procedure Code(s):        --- Professional ---                           [phone], Colonoscopy, flexible; diagnostic, including  collection of specimen(s) by brushing or washing,                            when performed (separate procedure) Diagnosis Code(s):        --- Professional ---                           K64.0, First degree hemorrhoids                           K92.1, Melena (includes Hematochezia)                           K57.30, Diverticulosis of large intestine without                            perforation or abscess without bleeding CPT copyright 2016 American Medical Association. All rights reserved. The codes documented in this report are preliminary and upon coder review may  be revised to meet current compliance requirements. Gerrit Friends. Sally-Anne Wamble, MD Gennette Pac, MD 07/19/2017 10:08:50 AM This report has been signed electronically. Number of Addenda: 0

## 2017-07-19 NOTE — Anesthesia Postprocedure Evaluation (Signed)
Anesthesia Post Note  Patient: Katie Joyce  Procedure(s) Performed: COLONOSCOPY WITH PROPOFOL (N/A ) ESOPHAGOGASTRODUODENOSCOPY (EGD) WITH PROPOFOL (N/A )  Patient location during evaluation: Short Stay Anesthesia Type: MAC Level of consciousness: awake and alert Pain control: 4/10. DRs. Rourk and [employer] aware. Vital Signs Assessment: post-procedure vital signs reviewed and stable Respiratory status: spontaneous breathing Cardiovascular status: stable Postop Assessment: no apparent nausea or vomiting Anesthetic complications: no     Last Vitals:  Vitals:   07/19/17 1044 07/19/17 1054  BP: 126/82 136/74  Pulse: 77 83  Resp: 15 18  Temp:  36.6 C  SpO2: 96% 96%    Last Pain:  Vitals:   07/19/17 1054  TempSrc: Oral  PainSc: 4                  Otis Burress

## 2017-07-19 NOTE — Telephone Encounter (Signed)
Pt stopped by office per Dr. Prudence DavidsonMR after her procedure today to pick up Dexilant 60mg . Take 1 capsule daily x 3 weeks. Samples given

## 2017-07-19 NOTE — Anesthesia Procedure Notes (Signed)
Procedure Name: MAC Date/Time: 07/19/2017 9:26 AM Performed by: Vista Deck, CRNA Pre-anesthesia Checklist: Patient identified, Emergency Drugs available, Suction available, Timeout performed and Patient being monitored Patient Re-evaluated:Patient Re-evaluated prior to induction Oxygen Delivery Method: Non-rebreather mask

## 2017-07-20 NOTE — Progress Notes (Signed)
Please let patient know her celiac screen was negative and cbc normal.

## 2017-07-21 ENCOUNTER — Encounter (HOSPITAL_COMMUNITY): Payer: Self-pay | Admitting: Internal Medicine

## 2017-10-04 ENCOUNTER — Encounter: Payer: BLUE CROSS/BLUE SHIELD | Admitting: Pediatrics

## 2017-10-05 ENCOUNTER — Encounter: Payer: Self-pay | Admitting: Pediatrics

## 2017-10-11 DIAGNOSIS — F3110 Bipolar disorder, current episode manic without psychotic features, unspecified: Secondary | ICD-10-CM | POA: Diagnosis not present

## 2017-10-13 ENCOUNTER — Encounter: Payer: Self-pay | Admitting: Internal Medicine

## 2017-11-19 ENCOUNTER — Ambulatory Visit: Payer: BLUE CROSS/BLUE SHIELD | Admitting: Pediatrics

## 2017-11-19 ENCOUNTER — Encounter: Payer: Self-pay | Admitting: Pediatrics

## 2017-11-19 VITALS — BP 130/78 | HR 104 | Temp 97.5°F | Ht 63.0 in | Wt 220.4 lb

## 2017-11-19 DIAGNOSIS — R413 Other amnesia: Secondary | ICD-10-CM | POA: Diagnosis not present

## 2017-11-19 DIAGNOSIS — E538 Deficiency of other specified B group vitamins: Secondary | ICD-10-CM | POA: Diagnosis not present

## 2017-11-19 DIAGNOSIS — R51 Headache: Secondary | ICD-10-CM

## 2017-11-19 DIAGNOSIS — R29818 Other symptoms and signs involving the nervous system: Secondary | ICD-10-CM | POA: Diagnosis not present

## 2017-11-19 DIAGNOSIS — R519 Headache, unspecified: Secondary | ICD-10-CM

## 2017-11-19 DIAGNOSIS — G629 Polyneuropathy, unspecified: Secondary | ICD-10-CM

## 2017-11-19 DIAGNOSIS — R296 Repeated falls: Secondary | ICD-10-CM

## 2017-11-19 NOTE — Progress Notes (Signed)
Subjective:   Patient ID: Katie Joyce, female    DOB: 02/17/1961, 57 y.o.   MRN: [id number] CC: Fall (Multiple)  HPI: Katie Joyce is a 57 y.o. female presenting for Fall (Multiple)  Has fallen 6-7 times since beginning of the year. Says past few years maybe 3-4 dozen times, will turn her ankle, often catches herself.  Not catching herself as well for the last few months.  Three days ago fell twice. First time she thought she stepped in a rut in the parking lot, but there wasn't one there when she looked. Doesn't think she tripped on anything the second time, face planted walking out of bathroom at home. Says she realizes "halfway through fall" that she is falling.  Has no warning signs that she is going to fall. Has bruising over R eye, below L eye and L side of chin from falls earlier this week.  3 weeks ago woke up on ground after getting up in middle of night to go to the bathroom.  Does not remember falling.  Does not know how long she was on the ground.  Having some memory lapses, notices it with driving, loses conversations that her husband says they had. Has had to google map herself home from work 3-4 times in past few months.  Last visit was started on gabapentin for neuropathy, got nightmares with it so didn't continue taking it after a couple of doses.  Also found to be vitamin B12 deficient.  Was started on oral vitamin B12, patient says that she could not tolerate it, had vomiting after taking it so she did not take it.  Eye exam 4 mo ago she reports to be normal. No hip pain, knee pain.  Getting ice-pick/vice-like headaches more frequently, now about 1 to 3-4 times a week. Hurts both sides of her head or at base of neck when HAs come.  This is new over the last 6 months.  She has a history of migraines.  Was started on Topamax years ago to help control bipolar disorder.  On the Topamax for migraines got better and for the most part resolved.  Sleeping a couple hours at a  time at night.  Has always had a hard time sleeping.  Has a h/o well-controlled bipolar d/o, sees psychiatrist every 3 mo. No recent changes in medicines.  Sometimes feels heart palpitations, feels like a panic attack, heart beating hard. Used to have panic attacks, now happening about once a week.  Think sometimes it happens within the hour of falling.  Does not happen right before the fall.  Sometimes will have a feeling of numbness in her left arm.  Comes out of the blue.  Lasts for a few minutes to up to 10 minutes.  No weakness on one side of the body or the other.  Relevant past medical, surgical, family and social history reviewed. Allergies and medications reviewed and updated. Social History   Tobacco Use  Smoking Status Current Every Day Smoker  . Packs/day: 0.50  . Years: 40.00  . Pack years: 20.00  . Types: Cigarettes  Smokeless Tobacco Never Used   ROS: Per HPI   Objective:    BP 130/78   Pulse (!) 104   Temp (!) 97.5 F (36.4 C) (Oral)   Ht 5' 3" (1.6 m)   Wt 220 lb 6.4 oz (100 kg)   BMI 39.04 kg/m   Wt Readings from Last 3 Encounters:  11/19/17 220 lb 6.4 oz (100   kg)  07/16/17 220 lb (99.8 kg)  07/13/17 220 lb (99.8 kg)    Gen: NAD, alert, cooperative with exam, NCAT EYES: EOMI, no conjunctival injection, or no icterus ENT:  TMs pearly gray b/l, OP without erythema LYMPH: no cervical LAD CV: NRRR, normal S1/S2, no murmur, distal pulses 2+ b/l Resp: CTABL, no wheezes, normal WOB Ext: No edema, warm Neuro: Alert and oriented, strength equal b/l UE and LE, sensation intact b/l ext, CN III-XII intact, coordination grossly normal, normal rapid-alternating movements, heel-shin, pt falls when standing still with eyes closed MSK: normal muscle bulk Skin: ttp over b/l eye brows, below L eye. Bruising in same locations. No step offs.  Assessment & Plan:  Adisen was seen today for fall.  Diagnoses and all orders for this visit:  Falls frequently Positive  romberg. L arm numbness/tingling comes and goes. Memory lapses, getting lost driving home from pt. Pt very worried about new symptoms. Has never happened before. Does have new headaches over past 6 mo. Will get MRI, refer to neurology.  -     EKG 12-Lead -     Ambulatory referral to Neurology -     Vitamin B12 -     Methylmalonic Acid, Serum -     CMP14+EGFR -     CBC with Differential  Other symptoms and signs involving the nervous system -     MR Brain W Wo Contrast; Future  Nonintractable headache, unspecified chronicity pattern, unspecified headache type  Memory loss  Vitamin B 12 def Replace  Neuropathy No worse. Replace b12 as above.  Follow up plan: 4 weeks Assunta Found, MD [place]

## 2017-11-22 ENCOUNTER — Other Ambulatory Visit: Payer: Self-pay | Admitting: Pediatrics

## 2017-11-22 ENCOUNTER — Encounter: Payer: Self-pay | Admitting: Pediatrics

## 2017-11-22 DIAGNOSIS — E538 Deficiency of other specified B group vitamins: Secondary | ICD-10-CM

## 2017-11-22 MED ORDER — CYANOCOBALAMIN 1000 MCG/ML IJ SOLN: INTRAMUSCULAR | 0 refills | Status: DC

## 2017-11-23 LAB — CBC WITH DIFFERENTIAL/PLATELET
Basophils Absolute: 0 10*3/uL (ref 0.0–0.2)
Basos: 1 %
EOS (ABSOLUTE): 0.2 10*3/uL (ref 0.0–0.4)
EOS: 2 %
HEMATOCRIT: 38.2 % (ref 34.0–46.6)
HEMOGLOBIN: 12.6 g/dL (ref 11.1–15.9)
IMMATURE GRANULOCYTES: 1 %
Immature Grans (Abs): 0.1 10*3/uL (ref 0.0–0.1)
LYMPHS ABS: 2.9 10*3/uL (ref 0.7–3.1)
Lymphs: 34 %
MCH: 28.5 pg (ref 26.6–33.0)
MCHC: 33 g/dL (ref 31.5–35.7)
MCV: 86 fL (ref 79–97)
MONOCYTES: 6 %
Monocytes Absolute: 0.5 10*3/uL (ref 0.1–0.9)
Neutrophils Absolute: 4.8 10*3/uL (ref 1.4–7.0)
Neutrophils: 56 %
Platelets: 302 10*3/uL (ref 150–379)
RBC: 4.42 x10E6/uL (ref 3.77–5.28)
RDW: 13.5 % (ref 12.3–15.4)
WBC: 8.5 10*3/uL (ref 3.4–10.8)

## 2017-11-23 LAB — CMP14+EGFR
ALBUMIN: 4.2 g/dL (ref 3.5–5.5)
ALK PHOS: 60 IU/L (ref 39–117)
ALT: 18 IU/L (ref 0–32)
AST: 15 IU/L (ref 0–40)
Albumin/Globulin Ratio: 1.6 (ref 1.2–2.2)
BUN / CREAT RATIO: 16 (ref 9–23)
BUN: 16 mg/dL (ref 6–24)
Bilirubin Total: 0.2 mg/dL (ref 0.0–1.2)
CALCIUM: 9.4 mg/dL (ref 8.7–10.2)
CO2: 17 mmol/L — AB (ref 20–29)
CREATININE: 1.02 mg/dL — AB (ref 0.57–1.00)
Chloride: 109 mmol/L — ABNORMAL HIGH (ref 96–106)
GFR calc Af Amer: 72 mL/min/{1.73_m2} (ref >59)
GFR calc non Af Amer: 63 mL/min/{1.73_m2} (ref >59)
GLUCOSE: 117 mg/dL — AB (ref 65–99)
Globulin, Total: 2.6 g/dL (ref 1.5–4.5)
Potassium: 4.1 mmol/L (ref 3.5–5.2)
Sodium: 143 mmol/L (ref 134–144)
Total Protein: 6.8 g/dL (ref 6.0–8.5)

## 2017-11-23 LAB — VITAMIN B12: VITAMIN B 12: 181 pg/mL — AB (ref 232–1245)

## 2017-11-23 LAB — METHYLMALONIC ACID, SERUM: Methylmalonic Acid: 410 nmol/L — ABNORMAL HIGH (ref 0–378)

## 2017-11-24 ENCOUNTER — Ambulatory Visit (INDEPENDENT_AMBULATORY_CARE_PROVIDER_SITE_OTHER): Payer: BLUE CROSS/BLUE SHIELD | Admitting: *Deleted

## 2017-11-24 DIAGNOSIS — E538 Deficiency of other specified B group vitamins: Secondary | ICD-10-CM | POA: Diagnosis not present

## 2017-11-24 MED ORDER — CYANOCOBALAMIN 1000 MCG/ML IJ SOLN
1000.0000 ug | Freq: Once | INTRAMUSCULAR | Status: AC
Start: 2017-11-24 — End: 2017-11-24
  Administered 2017-11-24: 1000 ug via INTRAMUSCULAR

## 2017-11-24 NOTE — Progress Notes (Signed)
Pt given Cyaonocobalamin inj Tolerated well

## 2017-11-29 ENCOUNTER — Encounter: Payer: Self-pay | Admitting: Neurology

## 2017-11-29 ENCOUNTER — Ambulatory Visit: Payer: BLUE CROSS/BLUE SHIELD | Admitting: Neurology

## 2017-11-29 VITALS — BP 135/79 | HR 87 | Ht 63.0 in | Wt 219.0 lb

## 2017-11-29 DIAGNOSIS — R269 Unspecified abnormalities of gait and mobility: Secondary | ICD-10-CM | POA: Diagnosis not present

## 2017-11-29 DIAGNOSIS — R413 Other amnesia: Secondary | ICD-10-CM | POA: Diagnosis not present

## 2017-11-29 NOTE — Patient Instructions (Signed)
  We will get blood work today and get EMG and NCV study to look at the nerve function of the legs. 

## 2017-11-29 NOTE — Progress Notes (Signed)
Reason for visit: Gait disturbance  Referring physician: Dr. Dudley Major is a 57 y.o. female  History of present illness:  Katie Joyce is a 57 year old right-handed white female with a history of bipolar disorder and obesity.  The patient comes into the office today indicating some problems with her ability to ambulate over the last 3-4 years.  The problems have gotten worse over the last 6 months.  She has fallen 4 or 5 times within the last 1 month, the last fall was around 16 November 2017.  The patient fell several times that day.  The patient had some bruising around the left eye following the fall.  The patient has been noted to have a vitamin B12 deficiency in October 2018, she was to go on oral vitamin B12 but could not tolerate it, she is now on the injections of B12.  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  The patient indicates that her balance is normal when she is walking and then suddenly she will fall and does not really understand why she falls.  She is not blacking out.  She denies palpitations of the heart or any chest pains.  She does have a history of some memory problems that have been going on for at least a year.  She will have troubles with directions with driving, and brief memory lapses.  She also reports occasional headache problems.  The patient may have some left arm numbness when she is leaning on the arm but not otherwise.  In January 2015 she had an episode of back pain and left leg pain and total paralysis of the left leg and some mild weakness of the left arm.  She had a complete workup that included MRI of the brain, cervical spine, thoracic spine, and lumbar spine.  A disc bulge at the left side at the L5-S1 level was noted, MRI of the brain showed mild white matter changes.  The  etiology of the leg paralysis was not clear.  The patient resolved this weakness she claims within 6 weeks after onset.  She is sent to this office for further evaluation of the following problems.  Past Medical History:  Diagnosis Date  . Anemia   . Bipolar 1 disorder (HCC)   . Degenerative disc disease, lumbar   . GERD (gastroesophageal reflux disease)   . Hypercholesteremia   . Low iron     Past Surgical History:  Procedure Laterality Date  . APPENDECTOMY    . BUNIONECTOMY Bilateral   . CATARACT EXTRACTION Right   . CHOLECYSTECTOMY    . COLONOSCOPY WITH PROPOFOL N/A 07/19/2017   Procedure: COLONOSCOPY WITH PROPOFOL;  Surgeon: Corbin Ade, MD;  Location: [facility];  Service: Endoscopy;  Laterality: N/A;  10:00am  . ESOPHAGOGASTRODUODENOSCOPY (EGD) WITH PROPOFOL N/A 07/19/2017   Procedure: ESOPHAGOGASTRODUODENOSCOPY (EGD) WITH PROPOFOL;  Surgeon: Corbin Ade, MD;  Location: [facility];  Service: Endoscopy;  Laterality: N/A;  . EYE SURGERY Right    cataract extraction  . TONSILLECTOMY AND ADENOIDECTOMY    . VAGINAL HYSTERECTOMY      Family History  Problem Relation Age of Onset  . Lung cancer Mother   . Pulmonary embolism Father   . Diabetes Brother   . Hypercholesterolemia Brother   .  Hypertension Brother   . Colon cancer Neg Hx   . Inflammatory bowel disease Neg Hx   . Celiac disease Neg Hx     Social history:  reports that she has been smoking cigarettes.  She has a 20.00 pack-year smoking history. She has never used smokeless tobacco. She reports that she does not drink alcohol or use drugs.  Medications:  Prior to Admission medications   Medication Sig Start Date End Date Taking? Authorizing Provider  ALPRAZolam Prudy Feeler(XANAX) 0.25 MG tablet Take 0.25 mg at bedtime by mouth.  02/18/16  Yes [provider]  Cholecalciferol (VITAMIN D3) 5000 units CAPS Take 1 capsule every evening by mouth.    Yes [provider]  cyanocobalamin (,VITAMIN  B-12,) 1000 MCG/ML injection Once a week x 4 weeks then once monthly 11/22/17  Yes Johna SheriffVincent, Carol L, MD  diphenhydrAMINE (BENADRYL) 25 mg capsule Take 50 mg at bedtime by mouth.   Yes [provider]  EPINEPHrine (EPIPEN 2-PAK) 0.3 mg/0.3 mL IJ SOAJ injection Inject 0.3 mLs (0.3 mg total) into the muscle once. 02/28/16  Yes Johna SheriffVincent, Carol L, MD  estrogens, conjugated, (PREMARIN) 0.625 MG tablet Take 1 tablet (0.625 mg total) by mouth daily. 07/02/17  Yes Johna SheriffVincent, Carol L, MD  fenofibrate (TRICOR) 145 MG tablet Take 1 tablet (145 mg total) by mouth daily. 06/30/17  Yes Johna SheriffVincent, Carol L, MD  ferrous sulfate 325 (65 FE) MG EC tablet Take 325 mg daily by mouth.    Yes [provider]  fexofenadine (ALLEGRA) 180 MG tablet Take 180 mg every evening by mouth.   Yes [provider]  Flaxseed, Linseed, (FLAX SEED OIL) 1000 MG CAPS Take 1 capsule every evening by mouth.    Yes [provider]  gabapentin (NEURONTIN) 300 MG capsule Take 1 capsule (300 mg total) by mouth at bedtime. 06/30/17  Yes Johna SheriffVincent, Carol L, MD  lamoTRIgine (LAMICTAL) 100 MG tablet Take 100 mg every evening by mouth.  12/06/16  Yes [provider]  omeprazole (PRILOSEC) 20 MG capsule Take 1 capsule (20 mg total) by mouth 2 (two) times daily before a meal. 06/30/17  Yes Johna SheriffVincent, Carol L, MD  topiramate (TOPAMAX) 100 MG tablet Take 100 mg every evening by mouth.    Yes [provider]      Allergies  Allergen Reactions  . Aspirin Anaphylaxis and Swelling  . Bee Venom Anaphylaxis  . Ciprofloxacin Shortness Of Breath and Palpitations  . Coconut Oil Anaphylaxis  . Fish Allergy Anaphylaxis  . Niacin And Related Rash    Increased bp  . Oatmeal Anaphylaxis  . Peanut-Containing Drug Products Anaphylaxis  . Penicillins Anaphylaxis  . Shellfish Allergy Anaphylaxis  . Clarithromycin Nausea And Vomiting    rash  . Lac Bovis Rash  . Nitrofurantoin Rash  . Sunscreens Rash  .  Calcium-Containing Compounds     Rash body, mouth ulcers  . Keflex [Cephalexin] Hives  . Eggs Or Egg-Derived Products Diarrhea  . Other Diarrhea    Melon - bloating  . Pork Allergy Diarrhea  . Prunus Persica Diarrhea    Peach  . Sulfa Antibiotics Rash    ROS:  Out of a complete 14 system review of symptoms, the patient complains only of the following symptoms, and all other reviewed systems are negative.  Ringing in the ears on the left Memory loss, headache, numbness of left face Insomnia, snoring Garbled speech, intermittent  Blood pressure 135/79, pulse 87, height 5\' 3"  (1.6 m), weight 219 lb (  99.3 kg).  Physical Exam  General: The patient is alert and cooperative at the time of the examination.  The patient is markedly obese.  Eyes: Pupils are equal, round, and reactive to light. Discs are flat bilaterally.  Neck: The neck is supple, no carotid bruits are noted.  Respiratory: The respiratory examination is clear.  Cardiovascular: The cardiovascular examination reveals a regular rate and rhythm, no obvious murmurs or rubs are noted.  Skin: Extremities are without significant edema.  Neurologic Exam  Mental status: The patient is alert and oriented x 3 at the time of the examination. The patient has apparent normal recent and remote memory, with an apparently normal attention span and concentration ability.  Cranial nerves: Facial symmetry is present. There is good sensation of the face to pinprick and soft touch bilaterally. The strength of the facial muscles and the muscles to head turning and shoulder shrug are normal bilaterally. Speech is well enunciated, no aphasia or dysarthria is noted. Extraocular movements are full. Visual fields are full. The tongue is midline, and the patient has symmetric elevation of the soft palate. No obvious hearing deficits are noted.  Motor: The motor testing reveals 5 over 5 strength of all 4 extremities. Good symmetric motor tone is  noted throughout.  Sensory: Sensory testing is intact to pinprick, soft touch, vibration sensation, and position sense on all 4 extremities, with exception of that there is a stocking pattern pinprick sensory deficit in the distal two thirds of the lower extremities below the knees. No evidence of extinction is noted.  Coordination: Cerebellar testing reveals good finger-nose-finger and heel-to-shin bilaterally.  Gait and station: Gait is normal. Tandem gait is slightly unsteady. Romberg is negative. No drift is seen.  Reflexes: Deep tendon reflexes are symmetric and normal bilaterally, with exception some slight decrease in the right ankle jerk, left ankle jerk is present. Toes are downgoing bilaterally.   Assessment/Plan:  1.  Gait disturbance  2.  Mild memory disturbance  3.  History of B12 deficiency  The patient is now on B12 supplementation.  The patient had an event in 2015 of paralysis of the left leg that may have been a psychogenic event.  The patient reports multiple episodes of falling, she has been recently placed on B12 injections.  The patient will undergo further blood work today.  She will have nerve conduction studies on both legs and EMG on the left leg.  She will follow-up for that study.  Otherwise, she will be seen in about 4 months.  MRI of the brain has been ordered, this has not yet been done.  Katie Palau MD 11/29/2017 11:59 AM  [facility] [address] [place], [place] [address]  Phone [phone] Fax [phone]

## 2017-12-01 ENCOUNTER — Encounter: Payer: Self-pay | Admitting: Pediatrics

## 2017-12-01 LAB — COPPER, SERUM: COPPER: 138 ug/dL (ref 72–166)

## 2017-12-01 LAB — SEDIMENTATION RATE: SED RATE: 21 mm/h (ref 0–40)

## 2017-12-01 LAB — B. BURGDORFI ANTIBODIES: Lyme IgG/IgM Ab: <0.91 {ISR} (ref 0.00–0.90)

## 2017-12-01 LAB — HIV ANTIBODY (ROUTINE TESTING W REFLEX): HIV SCREEN 4TH GENERATION: NONREACTIVE

## 2017-12-01 LAB — ANA W/REFLEX: Anti Nuclear Antibody(ANA): NEGATIVE

## 2017-12-01 LAB — RPR: RPR: NONREACTIVE

## 2017-12-01 LAB — ANGIOTENSIN CONVERTING ENZYME: Angio Convert Enzyme: 27 U/L (ref 14–82)

## 2017-12-20 ENCOUNTER — Ambulatory Visit (INDEPENDENT_AMBULATORY_CARE_PROVIDER_SITE_OTHER): Payer: BLUE CROSS/BLUE SHIELD | Admitting: Pediatrics

## 2017-12-20 ENCOUNTER — Encounter: Payer: Self-pay | Admitting: Pediatrics

## 2017-12-20 VITALS — BP 117/79 | HR 84 | Temp 97.3°F | Ht 63.0 in | Wt 217.4 lb

## 2017-12-20 DIAGNOSIS — Z0001 Encounter for general adult medical examination with abnormal findings: Secondary | ICD-10-CM | POA: Diagnosis not present

## 2017-12-20 DIAGNOSIS — E8940 Asymptomatic postprocedural ovarian failure: Secondary | ICD-10-CM

## 2017-12-20 DIAGNOSIS — K219 Gastro-esophageal reflux disease without esophagitis: Secondary | ICD-10-CM

## 2017-12-20 DIAGNOSIS — E785 Hyperlipidemia, unspecified: Secondary | ICD-10-CM

## 2017-12-20 DIAGNOSIS — N63 Unspecified lump in unspecified breast: Secondary | ICD-10-CM

## 2017-12-20 MED ORDER — OMEPRAZOLE 20 MG PO CPDR: 20.0000 mg | DELAYED_RELEASE_CAPSULE | Freq: Two times a day (BID) | ORAL | 1 refills | Status: DC

## 2017-12-20 MED ORDER — FENOFIBRATE 145 MG PO TABS: 145.0000 mg | ORAL_TABLET | Freq: Every day | ORAL | 1 refills | Status: DC

## 2017-12-20 MED ORDER — ESTROGENS CONJUGATED 0.625 MG PO TABS: 0.6250 mg | ORAL_TABLET | Freq: Every day | ORAL | 5 refills | Status: DC

## 2017-12-20 NOTE — Progress Notes (Signed)
  Subjective:   Patient ID: Katie Joyce, female    DOB: 1960/10/01, 57 y.o.   MRN: [id number] CC: Annual Exam  HPI: Katie Joyce is a 57 y.o. female presenting for Annual Exam  Continues to have falls, a few times a week.  Not any worse.  Catching herself recently, was walking in living room, had to lean on sofa. Legs not giving out from under her. Not feeling dizzy.  Has EMG with neurology scheduled for tomorrow.                   Vitamin B12 def: started injections.  Tolerating well.  Noticed a lump upper right breast last week in the shower.  Mildly tender.  No family history of breast cancer.  No redness.  Decrease estrogen replacement last visit, worsening night sweats, a couple days a week.  Appetite is been fine.  Relevant past medical, surgical, family and social history reviewed. Allergies and medications reviewed and updated. Social History   Tobacco Use  Smoking Status Current Every Day Smoker  . Packs/day: 0.50  . Years: 40.00  . Pack years: 20.00  . Types: Cigarettes  Smokeless Tobacco Never Used   ROS: All systems negative other than what is in the HPI  Objective:    BP 117/79   Pulse 84   Temp (!) 97.3 F (36.3 C) (Oral)   Ht 5' 3" (1.6 m)   Wt 217 lb 6.4 oz (98.6 kg)   BMI 38.51 kg/m   Wt Readings from Last 3 Encounters:  12/20/17 217 lb 6.4 oz (98.6 kg)  11/29/17 219 lb (99.3 kg)  11/19/17 220 lb 6.4 oz (100 kg)    Gen: NAD, alert, cooperative with exam, NCAT EYES: EOMI, no conjunctival injection, or no icterus ENT:  TMs pearly gray b/l, OP without erythema LYMPH: no cervical LAD CV: NRRR, normal S1/S2, no murmur, distal pulses 2+ b/l Resp: CTABL, no wheezes, normal WOB Abd: +BS, soft, NTND. no guarding or organomegaly Ext: No edema, warm Neuro: Alert and oriented, strength equal b/l UE and LE, coordination grossly normal MSK: normal muscle bulk Breast: Approximately 2 cm x 1 cm nodule middle of right upper breast.  No redness.  No  fluctuance.  Assessment & Plan:  [name] was seen today for annual exam.  Diagnoses and all orders for this visit:  Encounter for preventative adult health care exam with abnormal findings -     BMP8+EGFR -     Lipid panel -     Vitamin B12  Postsurgical ovarian failure Ideally will continue to wean estrogen.  Will hold off on weaning today given night sweat worsening.  Upcoming diagnostic mammogram.  Patient aware may need to stop estrogens abruptly. -     estrogens, conjugated, (PREMARIN) 0.625 MG tablet; Take 1 tablet (0.625 mg total) by mouth daily.  Hyperlipidemia, unspecified hyperlipidemia type -     fenofibrate (TRICOR) 145 MG tablet; Take 1 tablet (145 mg total) by mouth daily.  Gastroesophageal reflux disease, esophagitis presence not specified -     omeprazole (PRILOSEC) 20 MG capsule; Take 1 capsule (20 mg total) by mouth 2 (two) times daily before a meal.  Breast lump -     MM Digital Diagnostic Unilat R; Future   Follow up plan: Return in about 3 months (around 03/21/2018). Assunta Found, MD [place]

## 2017-12-21 ENCOUNTER — Ambulatory Visit (INDEPENDENT_AMBULATORY_CARE_PROVIDER_SITE_OTHER): Payer: BLUE CROSS/BLUE SHIELD | Admitting: Neurology

## 2017-12-21 ENCOUNTER — Encounter: Payer: Self-pay | Admitting: Neurology

## 2017-12-21 ENCOUNTER — Ambulatory Visit: Payer: BLUE CROSS/BLUE SHIELD | Admitting: Neurology

## 2017-12-21 DIAGNOSIS — R202 Paresthesia of skin: Secondary | ICD-10-CM | POA: Diagnosis not present

## 2017-12-21 DIAGNOSIS — R413 Other amnesia: Secondary | ICD-10-CM

## 2017-12-21 DIAGNOSIS — R1030 Lower abdominal pain, unspecified: Secondary | ICD-10-CM

## 2017-12-21 DIAGNOSIS — R269 Unspecified abnormalities of gait and mobility: Secondary | ICD-10-CM

## 2017-12-21 LAB — BMP8+EGFR
BUN / CREAT RATIO: 20 (ref 9–23)
BUN: 23 mg/dL (ref 6–24)
CHLORIDE: 106 mmol/L (ref 96–106)
CO2: 21 mmol/L (ref 20–29)
Calcium: 9.9 mg/dL (ref 8.7–10.2)
Creatinine, Ser: 1.14 mg/dL — ABNORMAL HIGH (ref 0.57–1.00)
GFR calc Af Amer: 63 mL/min/{1.73_m2} (ref >59)
GFR calc non Af Amer: 55 mL/min/{1.73_m2} — ABNORMAL LOW (ref >59)
Glucose: 100 mg/dL — ABNORMAL HIGH (ref 65–99)
POTASSIUM: 5.3 mmol/L — AB (ref 3.5–5.2)
SODIUM: 142 mmol/L (ref 134–144)

## 2017-12-21 LAB — VITAMIN B12: VITAMIN B 12: 660 pg/mL (ref 232–1245)

## 2017-12-21 LAB — LIPID PANEL
CHOL/HDL RATIO: 5.7 ratio — AB (ref 0.0–4.4)
Cholesterol, Total: 201 mg/dL — ABNORMAL HIGH (ref 100–199)
HDL: 35 mg/dL — ABNORMAL LOW (ref >39)
LDL Calculated: 104 mg/dL — ABNORMAL HIGH (ref 0–99)
Triglycerides: 309 mg/dL — ABNORMAL HIGH (ref 0–149)
VLDL Cholesterol Cal: 62 mg/dL — ABNORMAL HIGH (ref 5–40)

## 2017-12-21 NOTE — Progress Notes (Addendum)
Please refer to EMG procedure note.     MNC    Nerve / Sites Muscle Latency Ref. Amplitude Ref. Rel Amp Segments Distance Velocity Ref. Area    ms ms mV mV %  cm m/s m/s mVms  R Peroneal - EDB     Ankle EDB 3.3 ?6.5 8.1 ?2.0 100 Ankle - EDB 9   23.0     Fib head EDB 9.2  7.2  89 Fib head - Ankle 30 51 ?44 21.7     Pop fossa EDB 11.0  7.0  97.4 Pop fossa - Fib head 10 53 ?44 21.4         Pop fossa - Ankle      L Peroneal - EDB     Ankle EDB 3.4 ?6.5 6.3 ?2.0 100 Ankle - EDB 9   17.6     Fib head EDB 9.1  5.6  89.4 Fib head - Ankle 30 53 ?44 16.6     Pop fossa EDB 10.9  5.5  97 Pop fossa - Fib head 10 55 ?44 16.2         Pop fossa - Ankle      R Tibial - AH     Ankle AH 3.8 ?5.8 8.7 ?4.0 100 Ankle - AH 9   13.8     Pop fossa AH 11.2  7.9  89.9 Pop fossa - Ankle 35 47 ?41 14.3  L Tibial - AH     Ankle AH 3.9 ?5.8 7.9 ?4.0 100 Ankle - AH 9   15.0     Pop fossa AH 11.8  8.9  113 Pop fossa - Ankle 35 44 ?41 15.4             SNC    Nerve / Sites Rec. Site Peak Lat Ref.  Amp Ref. Segments Distance    ms ms V V  cm  R Sural - Ankle (Calf)     Calf Ankle 3.6 ?4.4 18 ?6 Calf - Ankle 14  L Sural - Ankle (Calf)     Calf Ankle 3.5 ?4.4 20 ?6 Calf - Ankle 14  R Superficial peroneal - Ankle     Lat leg Ankle 3.4 ?4.4 10 ?6 Lat leg - Ankle 14  L Superficial peroneal - Ankle     Lat leg Ankle 3.6 ?4.4 7 ?6 Lat leg - Ankle 14              F  Wave    Nerve F Lat Ref.   ms ms  R Tibial - AH 49.5 ?56.0  L Tibial - AH 50.1 ?56.0         EMG full       EMG Summary Table    Spontaneous MUAP Recruitment  Muscle IA Fib PSW Fasc Other Amp Dur. Poly Pattern  R. Abductor digiti minimi (manus) Normal None None None _______ Normal Normal Normal Normal

## 2017-12-21 NOTE — Procedures (Signed)
     HISTORY:  Katie ProseSherry Joyce is a 57 year old patient with a history of paresthesias and numbness in both legs and a history of paralysis of the left leg that has resolved.  The patient is being evaluated for a possible neuropathy or a lumbosacral radiculopathy.  The patient reports ongoing issues with gait instability, multiple falls.  NERVE CONDUCTION STUDIES:  Nerve conduction studies were performed on both lower extremities. The distal motor latencies and motor amplitudes for the peroneal and posterior tibial nerves were within normal limits. The nerve conduction velocities for these nerves were also normal. The F wave latencies for the posterior tibial nerves were normal. The sensory latencies for the peroneal and sural nerves were within normal limits.   EMG STUDIES:  EMG study was performed on the left lower extremity:  The tibialis anterior muscle reveals 2 to 4K motor units with full recruitment. No fibrillations or positive waves were seen. The peroneus tertius muscle reveals 2 to 4K motor units with full recruitment. No fibrillations or positive waves were seen. The medial gastrocnemius muscle reveals 1 to 3K motor units with full recruitment. No fibrillations or positive waves were seen. The vastus lateralis muscle reveals 2 to 4K motor units with full recruitment. No fibrillations or positive waves were seen. The iliopsoas muscle reveals 2 to 4K motor units with full recruitment. No fibrillations or positive waves were seen. The biceps femoris muscle (long head) reveals 2 to 4K motor units with full recruitment. No fibrillations or positive waves were seen. The lumbosacral paraspinal muscles were tested at 3 levels, and revealed no abnormalities of insertional activity at all 3 levels tested. There was good relaxation.   IMPRESSION:  Nerve conduction studies done on both lower extremities were within normal limits.  No evidence of a neuropathy is seen.  EMG evaluation of the  left lower extremity is normal, there is no evidence of an overlying lumbosacral radiculopathy.  [name]. Keith [name] MD 12/21/2017 1:42 PM  [facility] [address] [place], [place] [address]  Phone [phone] Fax [phone]

## 2017-12-21 NOTE — Progress Notes (Signed)
Please refer to EMG and nerve conduction study procedure note. 

## 2017-12-24 ENCOUNTER — Encounter: Payer: Self-pay | Admitting: Pediatrics

## 2017-12-26 DIAGNOSIS — R29818 Other symptoms and signs involving the nervous system: Secondary | ICD-10-CM | POA: Diagnosis not present

## 2017-12-29 NOTE — Telephone Encounter (Signed)
Labs reviewed.

## 2018-01-03 DIAGNOSIS — F3110 Bipolar disorder, current episode manic without psychotic features, unspecified: Secondary | ICD-10-CM | POA: Diagnosis not present

## 2018-01-17 ENCOUNTER — Other Ambulatory Visit: Payer: Self-pay | Admitting: *Deleted

## 2018-01-17 DIAGNOSIS — E538 Deficiency of other specified B group vitamins: Secondary | ICD-10-CM

## 2018-01-17 MED ORDER — CYANOCOBALAMIN 1000 MCG/ML IJ SOLN: INTRAMUSCULAR | 0 refills | Status: DC

## 2018-04-04 ENCOUNTER — Ambulatory Visit: Payer: BLUE CROSS/BLUE SHIELD | Admitting: Neurology

## 2018-04-05 DIAGNOSIS — F3110 Bipolar disorder, current episode manic without psychotic features, unspecified: Secondary | ICD-10-CM | POA: Diagnosis not present

## 2018-04-14 ENCOUNTER — Telehealth: Payer: Self-pay | Admitting: *Deleted

## 2018-04-14 NOTE — Telephone Encounter (Signed)
error 

## 2018-06-10 DIAGNOSIS — H25812 Combined forms of age-related cataract, left eye: Secondary | ICD-10-CM | POA: Diagnosis not present

## 2018-06-10 DIAGNOSIS — Z961 Presence of intraocular lens: Secondary | ICD-10-CM | POA: Diagnosis not present

## 2018-06-14 ENCOUNTER — Other Ambulatory Visit: Payer: Self-pay | Admitting: Pediatrics

## 2018-06-14 DIAGNOSIS — E785 Hyperlipidemia, unspecified: Secondary | ICD-10-CM

## 2018-07-04 DIAGNOSIS — F3110 Bipolar disorder, current episode manic without psychotic features, unspecified: Secondary | ICD-10-CM | POA: Diagnosis not present

## 2018-08-01 DIAGNOSIS — F3110 Bipolar disorder, current episode manic without psychotic features, unspecified: Secondary | ICD-10-CM | POA: Diagnosis not present

## 2018-08-09 ENCOUNTER — Ambulatory Visit: Payer: BLUE CROSS/BLUE SHIELD | Admitting: Family Medicine

## 2018-08-09 ENCOUNTER — Encounter: Payer: Self-pay | Admitting: Family Medicine

## 2018-08-09 VITALS — BP 138/83 | HR 85 | Temp 97.5°F | Ht 63.0 in | Wt 218.0 lb

## 2018-08-09 DIAGNOSIS — N3000 Acute cystitis without hematuria: Secondary | ICD-10-CM

## 2018-08-09 DIAGNOSIS — R300 Dysuria: Secondary | ICD-10-CM | POA: Diagnosis not present

## 2018-08-09 MED ORDER — DOXYCYCLINE HYCLATE 100 MG PO TABS: 100.0000 mg | ORAL_TABLET | Freq: Two times a day (BID) | ORAL | 0 refills | Status: DC

## 2018-08-09 NOTE — Progress Notes (Signed)
BP 138/83   Pulse 85   Temp (!) 97.5 F (36.4 C) (Oral)   Ht 5\' 3"  (1.6 m)   Wt 218 lb (98.9 kg)   BMI 38.62 kg/m    Subjective:    Patient ID: Katie Joyce, female    DOB: 1960-12-23, 57 y.o.   MRN: [id number]  HPI: Katie Joyce is a 57 y.o. female presenting on 08/09/2018 for Urinary Tract Infection (history of uti's; dysuria, frequency, abdominal pain)   HPI Dysuria and frequency and lower abdominal pain Patient comes in complaining of dysuria and frequency of lower abdominal pain that is been going on over the past couple of days.  She has a history of very frequent UTIs and has a lot of allergies secondary to medications that she is taken for UTIs.  She says she gets like this now about a few times out of the year.  She says she did have a referral to go see a urologist but they had to cancel the appointment with the urologist.  Patient says her lower abdomen hurts but she does not have any flank pain.  Relevant past medical, surgical, family and social history reviewed and updated as indicated. Interim medical history since our last visit reviewed. Allergies and medications reviewed and updated.  Review of Systems  Constitutional: Negative for chills and fever.  Eyes: Negative for visual disturbance.  Respiratory: Negative for chest tightness and shortness of breath.   Cardiovascular: Negative for chest pain and leg swelling.  Gastrointestinal: Positive for abdominal pain.  Genitourinary: Positive for dysuria, frequency and urgency. Negative for difficulty urinating, hematuria, vaginal bleeding, vaginal discharge and vaginal pain.  Musculoskeletal: Negative for back pain and gait problem.  Skin: Negative for rash.  Neurological: Negative for light-headedness and headaches.  Psychiatric/Behavioral: Negative for agitation and behavioral problems.  All other systems reviewed and are negative.   Per HPI unless specifically indicated above   Allergies as of  08/09/2018      Reactions   Aspirin Anaphylaxis, Swelling   Bee Venom Anaphylaxis   Ciprofloxacin Shortness Of Breath, Palpitations   Coconut Oil Anaphylaxis   Fish Allergy Anaphylaxis   Niacin And Related Rash   Increased bp   Oatmeal Anaphylaxis   Peanut-containing Drug Products Anaphylaxis   Penicillins Anaphylaxis   Shellfish Allergy Anaphylaxis   Clarithromycin Nausea And Vomiting   rash   Lac Bovis Rash   Nitrofurantoin Rash   Sunscreens Rash   Calcium-containing Compounds    Rash body, mouth ulcers   Keflex [cephalexin] Hives   Eggs Or Egg-derived Products Diarrhea   Other Diarrhea   Melon - bloating   Pork Allergy Diarrhea   Prunus Persica Diarrhea   Peach   Sulfa Antibiotics Rash      Medication List        Accurate as of 08/09/18  6:29 PM. Always use your most recent med list.          ALPRAZolam 0.25 MG tablet Commonly known as:  XANAX Take 0.25 mg at bedtime by mouth.   cyanocobalamin 1000 MCG/ML injection Commonly known as:  (VITAMIN B-12) Once a week x 4 weeks then once monthly   diphenhydrAMINE 25 mg capsule Commonly known as:  BENADRYL Take 50 mg at bedtime by mouth.   doxycycline 100 MG tablet Commonly known as:  VIBRA-TABS Take 1 tablet (100 mg total) by mouth 2 (two) times daily. 1 po bid   EPINEPHrine 0.3 mg/0.3 mL Soaj injection Commonly known as:  EPI-PEN Inject 0.3 mLs (0.3 mg total) into the muscle once.   estrogens (conjugated) 0.625 MG tablet Commonly known as:  PREMARIN Take 1 tablet (0.625 mg total) by mouth daily.   fenofibrate 145 MG tablet Commonly known as:  TRICOR Take 1 tablet (145 mg total) by mouth daily. (Needs to be seen before next refill)   ferrous sulfate 325 (65 FE) MG EC tablet Take 325 mg daily by mouth.   fexofenadine 180 MG tablet Commonly known as:  ALLEGRA Take 180 mg every evening by mouth.   Flax Seed Oil 1000 MG Caps Take 1 capsule every evening by mouth.   lamoTRIgine 100 MG  tablet Commonly known as:  LAMICTAL Take 100 mg every evening by mouth.   omeprazole 20 MG capsule Commonly known as:  PRILOSEC Take 1 capsule (20 mg total) by mouth 2 (two) times daily before a meal.   topiramate 100 MG tablet Commonly known as:  TOPAMAX Take 100 mg every evening by mouth.   Vitamin D3 125 MCG (5000 UT) Caps Take 1 capsule every evening by mouth.          Objective:    BP 138/83   Pulse 85   Temp (!) 97.5 F (36.4 C) (Oral)   Ht 5\' 3"  (1.6 m)   Wt 218 lb (98.9 kg)   BMI 38.62 kg/m   Wt Readings from Last 3 Encounters:  08/09/18 218 lb (98.9 kg)  12/20/17 217 lb 6.4 oz (98.6 kg)  11/29/17 219 lb (99.3 kg)    Physical Exam  Constitutional: She is oriented to person, place, and time. She appears well-developed and well-nourished. No distress.  Eyes: Conjunctivae are normal.  Cardiovascular: Normal rate, regular rhythm, normal heart sounds and intact distal pulses.  No murmur heard. Pulmonary/Chest: Effort normal and breath sounds normal. No respiratory distress. She has no wheezes.  Abdominal: Soft. Bowel sounds are normal. She exhibits no distension and no mass. There is tenderness (Suprapubic tenderness but no flank pain) in the suprapubic area. There is no rigidity, no rebound, no guarding and no CVA tenderness.  Neurological: She is alert and oriented to person, place, and time. Coordination normal.  Skin: Skin is warm and dry. No rash noted. She is not diaphoretic.  Psychiatric: She has a normal mood and affect. Her behavior is normal.  Nursing note and vitals reviewed.   Urinalysis dip: Completely negative    Assessment & Plan:   Problem List Items Addressed This Visit    None    Visit Diagnoses    Acute cystitis without hematuria    -  Primary   Relevant Medications   doxycycline (VIBRA-TABS) 100 MG tablet   Other Relevant Orders   Urine Culture   Urinalysis       Follow up plan: Return if symptoms worsen or fail to  improve.  Counseling provided for all of the vaccine components Orders Placed This Encounter  Procedures  . Urine Culture  . Urinalysis    Arville CareJoshua Myrna Vonseggern, MD [facility] 08/09/2018, 6:29 PM

## 2018-08-10 DIAGNOSIS — H02834 Dermatochalasis of left upper eyelid: Secondary | ICD-10-CM | POA: Diagnosis not present

## 2018-08-10 DIAGNOSIS — H25812 Combined forms of age-related cataract, left eye: Secondary | ICD-10-CM | POA: Diagnosis not present

## 2018-08-10 DIAGNOSIS — H527 Unspecified disorder of refraction: Secondary | ICD-10-CM | POA: Diagnosis not present

## 2018-08-10 DIAGNOSIS — H26491 Other secondary cataract, right eye: Secondary | ICD-10-CM | POA: Diagnosis not present

## 2018-08-10 DIAGNOSIS — H31002 Unspecified chorioretinal scars, left eye: Secondary | ICD-10-CM | POA: Diagnosis not present

## 2018-08-10 DIAGNOSIS — H43811 Vitreous degeneration, right eye: Secondary | ICD-10-CM | POA: Diagnosis not present

## 2018-08-10 LAB — URINALYSIS
Bilirubin, UA: NEGATIVE
Glucose, UA: NEGATIVE
Ketones, UA: NEGATIVE
Leukocytes, UA: NEGATIVE
Nitrite, UA: NEGATIVE
PH UA: 6.5 (ref 5.0–7.5)
PROTEIN UA: NEGATIVE
RBC, UA: NEGATIVE
SPEC GRAV UA: 1.02 (ref 1.005–1.030)
Urobilinogen, Ur: 0.2 mg/dL (ref 0.2–1.0)

## 2018-08-12 LAB — URINE CULTURE

## 2018-08-15 ENCOUNTER — Other Ambulatory Visit: Payer: Self-pay | Admitting: Pediatrics

## 2018-08-15 DIAGNOSIS — H25812 Combined forms of age-related cataract, left eye: Secondary | ICD-10-CM | POA: Diagnosis not present

## 2018-08-15 DIAGNOSIS — E8940 Asymptomatic postprocedural ovarian failure: Secondary | ICD-10-CM

## 2018-08-18 DIAGNOSIS — E785 Hyperlipidemia, unspecified: Secondary | ICD-10-CM | POA: Diagnosis not present

## 2018-08-18 DIAGNOSIS — M5136 Other intervertebral disc degeneration, lumbar region: Secondary | ICD-10-CM | POA: Diagnosis not present

## 2018-08-18 DIAGNOSIS — Z9841 Cataract extraction status, right eye: Secondary | ICD-10-CM | POA: Diagnosis not present

## 2018-08-18 DIAGNOSIS — H02834 Dermatochalasis of left upper eyelid: Secondary | ICD-10-CM | POA: Diagnosis not present

## 2018-08-18 DIAGNOSIS — H527 Unspecified disorder of refraction: Secondary | ICD-10-CM | POA: Diagnosis not present

## 2018-08-18 DIAGNOSIS — H5213 Myopia, bilateral: Secondary | ICD-10-CM | POA: Diagnosis not present

## 2018-08-18 DIAGNOSIS — H25812 Combined forms of age-related cataract, left eye: Secondary | ICD-10-CM | POA: Diagnosis not present

## 2018-08-18 DIAGNOSIS — Z6838 Body mass index (BMI) 38.0-38.9, adult: Secondary | ICD-10-CM | POA: Diagnosis not present

## 2018-08-18 DIAGNOSIS — F17200 Nicotine dependence, unspecified, uncomplicated: Secondary | ICD-10-CM | POA: Diagnosis not present

## 2018-08-18 DIAGNOSIS — F319 Bipolar disorder, unspecified: Secondary | ICD-10-CM | POA: Diagnosis not present

## 2018-08-18 DIAGNOSIS — Z961 Presence of intraocular lens: Secondary | ICD-10-CM | POA: Diagnosis not present

## 2018-08-18 DIAGNOSIS — Z79899 Other long term (current) drug therapy: Secondary | ICD-10-CM | POA: Diagnosis not present

## 2018-08-18 DIAGNOSIS — H43811 Vitreous degeneration, right eye: Secondary | ICD-10-CM | POA: Diagnosis not present

## 2018-08-18 DIAGNOSIS — H524 Presbyopia: Secondary | ICD-10-CM | POA: Diagnosis not present

## 2018-08-18 DIAGNOSIS — H02831 Dermatochalasis of right upper eyelid: Secondary | ICD-10-CM | POA: Diagnosis not present

## 2018-08-18 DIAGNOSIS — Z9849 Cataract extraction status, unspecified eye: Secondary | ICD-10-CM | POA: Diagnosis not present

## 2018-08-18 DIAGNOSIS — E669 Obesity, unspecified: Secondary | ICD-10-CM | POA: Diagnosis not present

## 2018-08-18 DIAGNOSIS — H31002 Unspecified chorioretinal scars, left eye: Secondary | ICD-10-CM | POA: Diagnosis not present

## 2018-08-18 DIAGNOSIS — K219 Gastro-esophageal reflux disease without esophagitis: Secondary | ICD-10-CM | POA: Diagnosis not present

## 2018-08-19 DIAGNOSIS — H31002 Unspecified chorioretinal scars, left eye: Secondary | ICD-10-CM | POA: Diagnosis not present

## 2018-08-19 DIAGNOSIS — H02834 Dermatochalasis of left upper eyelid: Secondary | ICD-10-CM | POA: Diagnosis not present

## 2018-08-19 DIAGNOSIS — Z961 Presence of intraocular lens: Secondary | ICD-10-CM | POA: Diagnosis not present

## 2018-08-19 DIAGNOSIS — H26491 Other secondary cataract, right eye: Secondary | ICD-10-CM | POA: Diagnosis not present

## 2018-08-28 ENCOUNTER — Other Ambulatory Visit: Payer: Self-pay | Admitting: Pediatrics

## 2018-08-28 DIAGNOSIS — E538 Deficiency of other specified B group vitamins: Secondary | ICD-10-CM

## 2018-10-03 DIAGNOSIS — F3110 Bipolar disorder, current episode manic without psychotic features, unspecified: Secondary | ICD-10-CM | POA: Diagnosis not present

## 2018-11-19 IMAGING — DX DG ABDOMEN 1V
2 series · 2 of 2 positions shown · non-contrast
Comparison: None.

CLINICAL DATA: Abdominal pain with nausea.

EXAM:
ABDOMEN - 1 VIEW

[abdomen kub (1 of 2)]
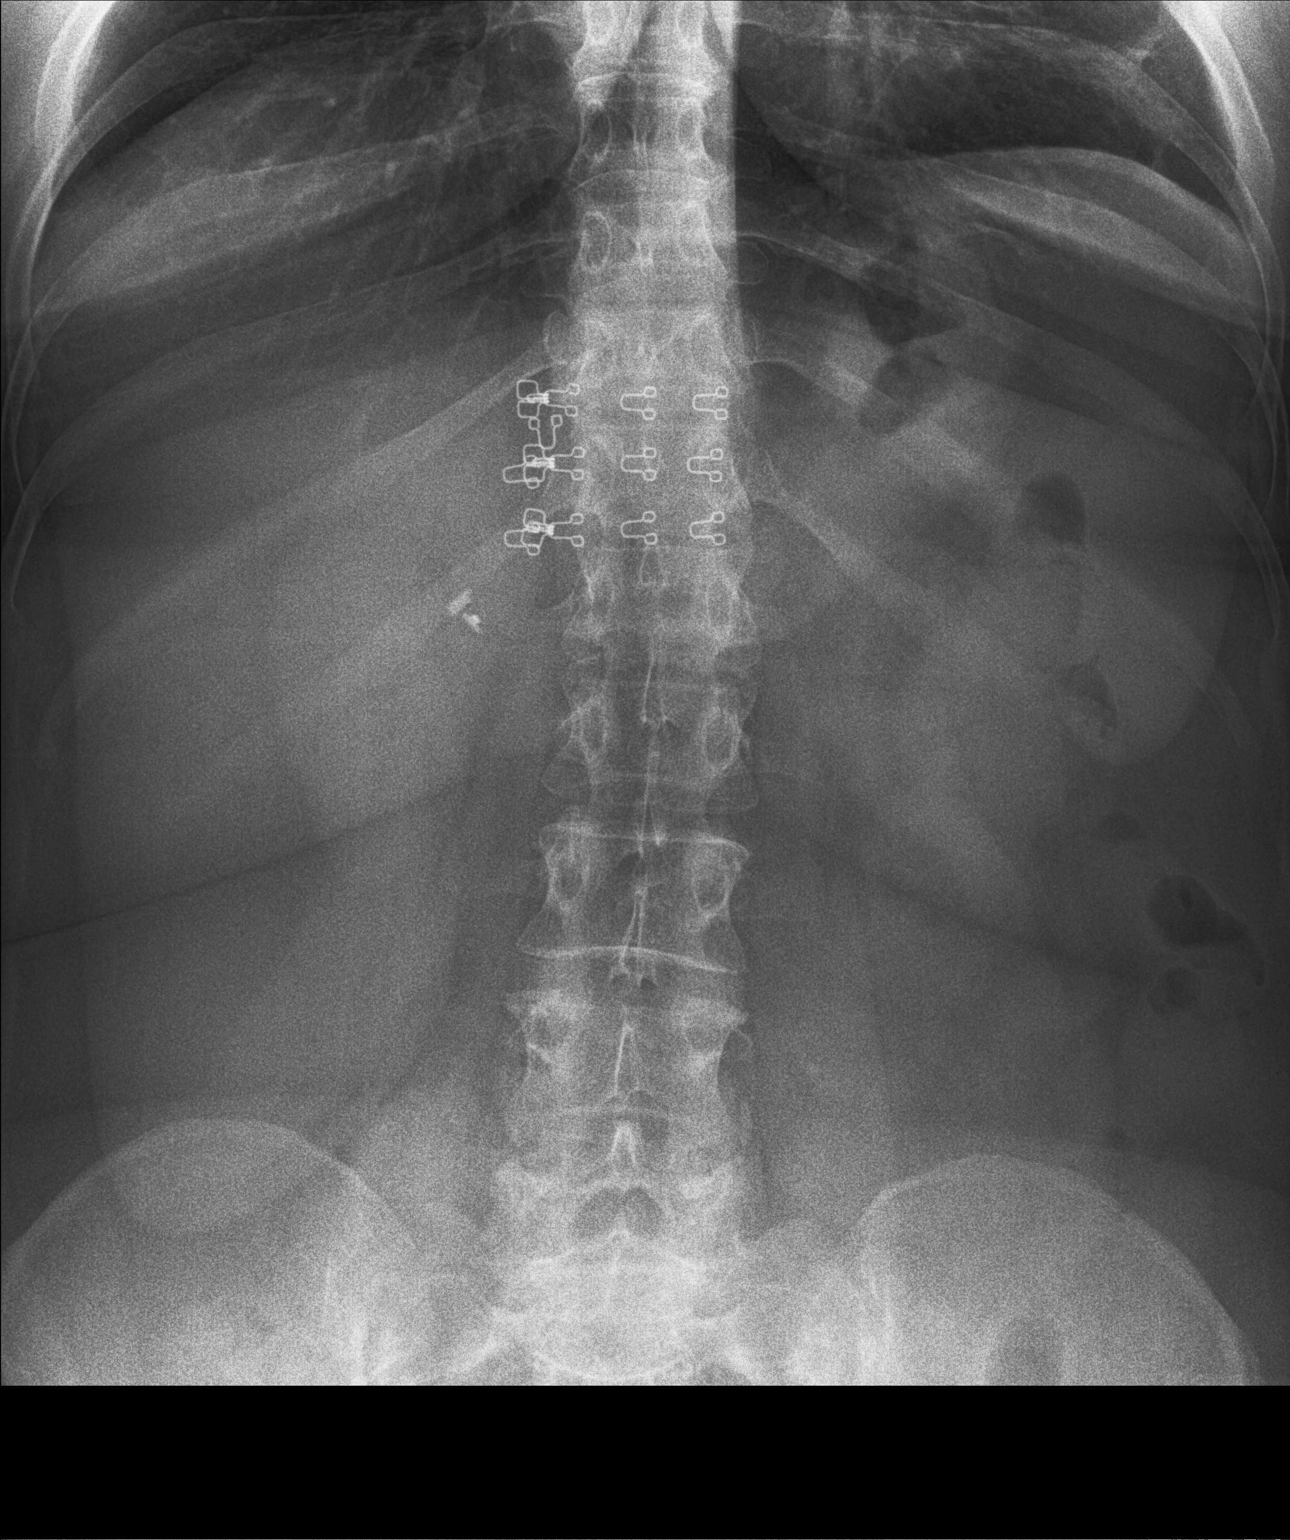

[abdomen kub (2 of 2)]
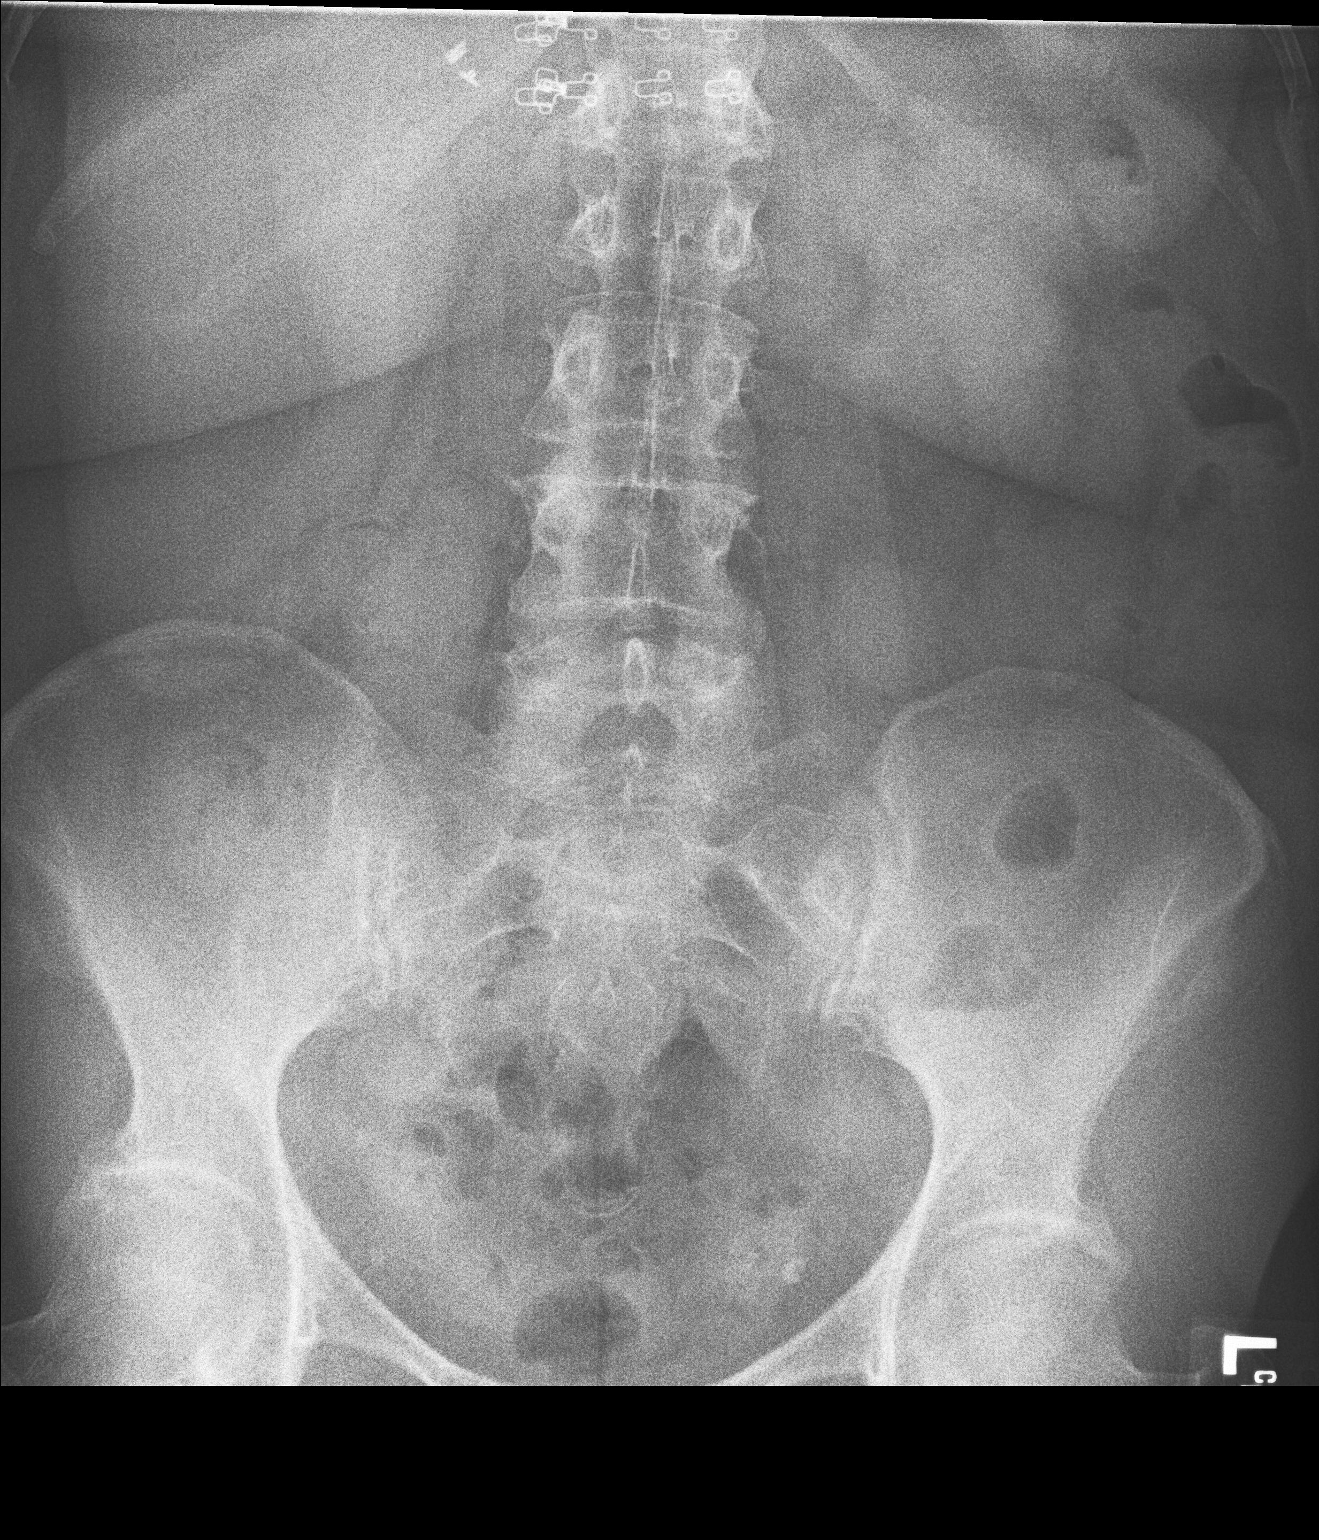

[2 of 2 positions shown; findings below may reference images not displayed]

FINDINGS: The bowel gas pattern is normal. No radio-opaque calculi or other
significant radiographic abnormality are seen.
IMPRESSION: Negative.

## 2019-01-01 ENCOUNTER — Other Ambulatory Visit: Payer: Self-pay | Admitting: Pediatrics

## 2019-01-01 DIAGNOSIS — E785 Hyperlipidemia, unspecified: Secondary | ICD-10-CM

## 2019-01-02 DIAGNOSIS — F3110 Bipolar disorder, current episode manic without psychotic features, unspecified: Secondary | ICD-10-CM | POA: Diagnosis not present

## 2019-01-03 ENCOUNTER — Other Ambulatory Visit: Payer: Self-pay | Admitting: Pediatrics

## 2019-01-03 DIAGNOSIS — E785 Hyperlipidemia, unspecified: Secondary | ICD-10-CM

## 2019-01-04 NOTE — Telephone Encounter (Signed)
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 Last Rf 06/15/18

## 2019-01-04 NOTE — Telephone Encounter (Signed)
appt made for 01/11/19 

## 2019-01-11 ENCOUNTER — Encounter: Payer: Self-pay | Admitting: Family Medicine

## 2019-01-11 ENCOUNTER — Other Ambulatory Visit: Payer: Self-pay

## 2019-01-11 ENCOUNTER — Ambulatory Visit (INDEPENDENT_AMBULATORY_CARE_PROVIDER_SITE_OTHER): Payer: BLUE CROSS/BLUE SHIELD | Admitting: Family Medicine

## 2019-01-11 DIAGNOSIS — E782 Mixed hyperlipidemia: Secondary | ICD-10-CM | POA: Diagnosis not present

## 2019-01-11 DIAGNOSIS — K219 Gastro-esophageal reflux disease without esophagitis: Secondary | ICD-10-CM

## 2019-01-11 DIAGNOSIS — E538 Deficiency of other specified B group vitamins: Secondary | ICD-10-CM | POA: Diagnosis not present

## 2019-01-11 DIAGNOSIS — E8940 Asymptomatic postprocedural ovarian failure: Secondary | ICD-10-CM

## 2019-01-11 MED ORDER — ESTROGENS CONJUGATED 0.625 MG PO TABS: 0.6250 mg | ORAL_TABLET | Freq: Every day | ORAL | 5 refills | Status: DC

## 2019-01-11 MED ORDER — EPINEPHRINE 0.3 MG/0.3ML IJ SOAJ: 0.3000 mg | Freq: Once | INTRAMUSCULAR | 0 refills | Status: AC

## 2019-01-11 MED ORDER — NEEDLES & SYRINGES MISC: 1.0000 | Freq: Once | 1 refills | Status: AC

## 2019-01-11 MED ORDER — OMEPRAZOLE 20 MG PO CPDR: 20.0000 mg | DELAYED_RELEASE_CAPSULE | Freq: Two times a day (BID) | ORAL | 1 refills | Status: DC

## 2019-01-11 MED ORDER — FENOFIBRATE 145 MG PO TABS: 145.0000 mg | ORAL_TABLET | Freq: Every day | ORAL | 1 refills | Status: DC

## 2019-01-11 NOTE — Progress Notes (Signed)
Virtual Visit via telephone Note Due to COVID-19, visit is conducted virtually and was requested by patient. This visit type was conducted due to national recommendations for restrictions regarding the COVID-19 Pandemic (e.g. social distancing) in an effort to limit this patient's exposure and mitigate transmission in our community. All issues noted in this document were discussed and addressed.  A physical exam was not performed with this format.   I connected with Katie Joyce on 01/11/19 at 0820 by telephone and verified that I am speaking with the correct person using two identifiers. Katie Joyce is currently located at home and family is currently with them during visit. The provider, Kari Baars, FNP is located in their office at time of visit.  I discussed the limitations, risks, security and privacy concerns of performing an evaluation and management service by telephone and the availability of in person appointments. I also discussed with the patient that there may be a patient responsible charge related to this service. The patient expressed understanding and agreed to proceed.  Subjective:  Patient ID: Katie Joyce, female    DOB: Sep 03, 1960, 58 y.o.   MRN: [id number]  Chief Complaint:  Medical Management of Chronic Issues and Hyperlipidemia   HPI: Katie Joyce is a 58 y.o. female presenting on 01/11/2019 for Medical Management of Chronic Issues and Hyperlipidemia   Pt states she is doing well overall. States she is compliant with her medications without associated side effects. She denies chest pain, shortness of breath, leg swelling, weight changes, fever, chills, headaches, or confusion.   Hyperlipidemia  This is a chronic problem. The current episode started more than 1 year ago. The problem is controlled. Recent lipid tests were reviewed and are high. Factors aggravating her hyperlipidemia include fatty foods and estrogen. Pertinent negatives include no chest  pain, focal sensory loss, focal weakness, leg pain, myalgias or shortness of breath. Current antihyperlipidemic treatment includes fibric acid derivatives. There are no compliance problems.  Risk factors for coronary artery disease include post-menopausal.  Gastroesophageal Reflux  She reports no abdominal pain, no belching, no chest pain, no choking, no coughing, no dysphagia, no early satiety, no globus sensation, no heartburn, no hoarse voice, no nausea, no sore throat, no stridor, no tooth decay, no water brash or no wheezing. This is a chronic problem. The current episode started more than 1 year ago. The problem occurs rarely. The problem has been waxing and waning. The symptoms are aggravated by certain foods, caffeine and ETOH. Pertinent negatives include no anemia, fatigue, melena, muscle weakness, orthopnea or weight loss. She has tried a PPI for the symptoms. The treatment provided significant relief.     Relevant past medical, surgical, family, and social history reviewed and updated as indicated.  Allergies and medications reviewed and updated.   Past Medical History:  Diagnosis Date  . Anemia   . Bipolar 1 disorder (HCC)   . Degenerative disc disease, lumbar   . GERD (gastroesophageal reflux disease)   . Hypercholesteremia   . Low iron     Past Surgical History:  Procedure Laterality Date  . APPENDECTOMY    . BUNIONECTOMY Bilateral   . CATARACT EXTRACTION Right   . CHOLECYSTECTOMY    . COLONOSCOPY WITH PROPOFOL N/A 07/19/2017   Procedure: COLONOSCOPY WITH PROPOFOL;  Surgeon: Corbin Ade, MD;  [facility];  Service: Endoscopy;  Laterality: N/A;  10:00am  . ESOPHAGOGASTRODUODENOSCOPY (EGD) WITH PROPOFOL N/A 07/19/2017   Procedure: ESOPHAGOGASTRODUODENOSCOPY (EGD) WITH PROPOFOL;  Surgeon: Corbin Adeourk, Robert M, MD;  [facility];  Service: Endoscopy;  Laterality: N/A;  . EYE SURGERY Right    cataract extraction  . TONSILLECTOMY AND ADENOIDECTOMY    .  VAGINAL HYSTERECTOMY      Social History   Socioeconomic History  . Marital status: Married    Spouse name: Not on file  . Number of children: Not on file  . Years of education: Not on file  . Highest education level: Not on file  Occupational History  . Occupation: [employer]  Social Needs  . Financial resource strain: Not on file  . Food insecurity:    Worry: Not on file    Inability: Not on file  . Transportation needs:    Medical: Not on file    Non-medical: Not on file  Tobacco Use  . Smoking status: Current Every Day Smoker    Packs/day: 0.50    Years: 40.00    Pack years: 20.00    Types: Cigarettes  . Smokeless tobacco: Never Used  Substance and Sexual Activity  . Alcohol use: No  . Drug use: No  . Sexual activity: Yes    Birth control/protection: Surgical  Lifestyle  . Physical activity:    Days per week: Not on file    Minutes per session: Not on file  . Stress: Not on file  Relationships  . Social connections:    Talks on phone: Not on file    Gets together: Not on file    Attends religious service: Not on file    Active member of club or organization: Not on file    Attends meetings of clubs or organizations: Not on file    Relationship status: Not on file  . Intimate partner violence:    Fear of current or ex partner: Not on file    Emotionally abused: Not on file    Physically abused: Not on file    Forced sexual activity: Not on file  Other Topics Concern  . Not on file  Social History Narrative   Lives with husband and brother   Caffeine use:1 cup coffee per day, soda daily   Right handed    Outpatient Encounter Medications as of 01/11/2019  Medication Sig  . ALPRAZolam (XANAX) 0.25 MG tablet Take 0.25 mg at bedtime by mouth.   . Cholecalciferol (VITAMIN D3) 5000 units CAPS Take 1 capsule every evening by mouth.   . cyanocobalamin (,VITAMIN B-12,) 1000 MCG/ML injection INJECT ONCE A WEEK FOR 4 WEEKS THEN ONCE MONTHLY  . diphenhydrAMINE  (BENADRYL) 25 mg capsule Take 50 mg at bedtime by mouth.  . doxycycline (VIBRA-TABS) 100 MG tablet Take 1 tablet (100 mg total) by mouth 2 (two) times daily. 1 po bid  . EPINEPHrine (EPIPEN 2-PAK) 0.3 mg/0.3 mL IJ SOAJ injection Inject 0.3 mLs (0.3 mg total) into the muscle once for 1 dose.  . estrogens, conjugated, (PREMARIN) 0.625 MG tablet Take 1 tablet (0.625 mg total) by mouth daily. Take daily for 21 days then do not take for 7 days.  . fenofibrate (TRICOR) 145 MG tablet Take 1 tablet (145 mg total) by mouth daily. (Needs to be seen before next refill)  . ferrous sulfate 325 (65 FE) MG EC tablet Take 325 mg daily by mouth.   . fexofenadine (ALLEGRA) 180 MG tablet Take 180 mg every evening by mouth.  . Flaxseed, Linseed, (FLAX SEED OIL) 1000 MG CAPS Take 1 capsule every evening by mouth.   .Marland Kitchen  lamoTRIgine (LAMICTAL) 100 MG tablet Take 100 mg every evening by mouth.   . Needles & Syringes MISC 1 each by Does not apply route once for 1 dose.  Marland Kitchen omeprazole (PRILOSEC) 20 MG capsule Take 1 capsule (20 mg total) by mouth 2 (two) times daily before a meal.  . topiramate (TOPAMAX) 100 MG tablet Take 100 mg every evening by mouth.   . [DISCONTINUED] EPINEPHrine (EPIPEN 2-PAK) 0.3 mg/0.3 mL IJ SOAJ injection Inject 0.3 mLs (0.3 mg total) into the muscle once.  . [DISCONTINUED] fenofibrate (TRICOR) 145 MG tablet Take 1 tablet (145 mg total) by mouth daily. (Needs to be seen before next refill)  . [DISCONTINUED] omeprazole (PRILOSEC) 20 MG capsule Take 1 capsule (20 mg total) by mouth 2 (two) times daily before a meal.  . [DISCONTINUED] PREMARIN 0.625 MG tablet TAKE 1 TABLET BY MOUTH ONCE DAILY   No facility-administered encounter medications on file as of 01/11/2019.     Allergies  Allergen Reactions  . Aspirin Anaphylaxis and Swelling  . Bee Venom Anaphylaxis  . Ciprofloxacin Shortness Of Breath and Palpitations  . Coconut Oil Anaphylaxis  . Fish Allergy Anaphylaxis  . Niacin And Related Rash     Increased bp  . Oatmeal Anaphylaxis  . Peanut-Containing Drug Products Anaphylaxis  . Penicillins Anaphylaxis  . Shellfish Allergy Anaphylaxis  . Clarithromycin Nausea And Vomiting    rash  . Lac Bovis Rash  . Nitrofurantoin Rash  . Sunscreens Rash  . Calcium-Containing Compounds     Rash body, mouth ulcers  . Keflex [Cephalexin] Hives  . Eggs Or Egg-Derived Products Diarrhea  . Other Diarrhea    Melon - bloating  . Pork Allergy Diarrhea  . Prunus Persica Diarrhea    Peach  . Sulfa Antibiotics Rash    Review of Systems  Constitutional: Negative for activity change, appetite change, chills, diaphoresis, fatigue, fever, unexpected weight change and weight loss.  HENT: Negative for hoarse voice and sore throat.   Eyes: Negative for photophobia and visual disturbance.  Respiratory: Negative for cough, choking, chest tightness, shortness of breath and wheezing.   Cardiovascular: Negative for chest pain, palpitations and leg swelling.  Gastrointestinal: Negative for abdominal distention, abdominal pain, anal bleeding, blood in stool, constipation, diarrhea, dysphagia, heartburn, melena, nausea, rectal pain and vomiting.  Genitourinary: Negative for pelvic pain, vaginal bleeding, vaginal discharge and vaginal pain.  Musculoskeletal: Negative for myalgias and muscle weakness.  Neurological: Negative for dizziness, tremors, focal weakness, seizures, syncope, facial asymmetry, speech difficulty, weakness, light-headedness, numbness and headaches.  Psychiatric/Behavioral: Negative for confusion.  All other systems reviewed and are negative.        Observations/Objective: No vital signs or physical exam, this was a telephone or virtual health encounter.  Pt alert and oriented, answers all questions appropriately, and able to speak in full sentences.    Assessment and Plan: Katie Joyce was seen today for medical management of chronic issues and hyperlipidemia.  Diagnoses and all orders  for this visit:  Mixed hyperlipidemia Diet and exercise encouraged. Continue below. Will check labs at next appointment.  -     fenofibrate (TRICOR) 145 MG tablet; Take 1 tablet (145 mg total) by mouth daily. (Needs to be seen before next refill)  Postsurgical ovarian failure Doing well on premarin. No associated side effects. Continue below.  -     estrogens, conjugated, (PREMARIN) 0.625 MG tablet; Take 1 tablet (0.625 mg total) by mouth daily. Take daily for 21 days then do not take for 7  days.  Gastroesophageal reflux disease without esophagitis Well controlled on omeprazole. Continue below. Avoid triggers.  -     omeprazole (PRILOSEC) 20 MG capsule; Take 1 capsule (20 mg total) by mouth 2 (two) times daily before a meal.  Vitamin B12 deficiency Doing well on monthly B12 injections. Needs refill on syringes and needles, will refill today. Labs at next appointment.  -     Needles & Syringes MISC; 1 each by Does not apply route once for 1 dose.    Follow Up Instructions: No follow-ups on file.    I discussed the assessment and treatment plan with the patient. The patient was provided an opportunity to ask questions and all were answered. The patient agreed with the plan and demonstrated an understanding of the instructions.   The patient was advised to call back or seek an in-person evaluation if the symptoms worsen or if the condition fails to improve as anticipated.  The above assessment and management plan was discussed with the patient. The patient verbalized understanding of and has agreed to the management plan. Patient is aware to call the clinic if symptoms persist or worsen. Patient is aware when to return to the clinic for a follow-up visit. Patient educated on when it is appropriate to go to the emergency department.    I provided 25 minutes of non-face-to-face time during this encounter. The call started at 0820. The call ended at 40. The other time was used for  coordination of care.    Kari Baars, FNP-C [facility] [address] [place], [place] [address] [phone]

## 2019-03-09 ENCOUNTER — Ambulatory Visit (INDEPENDENT_AMBULATORY_CARE_PROVIDER_SITE_OTHER): Payer: BC Managed Care – PPO | Admitting: Family Medicine

## 2019-03-09 ENCOUNTER — Telehealth: Payer: BLUE CROSS/BLUE SHIELD | Admitting: Family

## 2019-03-09 ENCOUNTER — Other Ambulatory Visit: Payer: Self-pay

## 2019-03-09 DIAGNOSIS — J069 Acute upper respiratory infection, unspecified: Secondary | ICD-10-CM | POA: Diagnosis not present

## 2019-03-09 DIAGNOSIS — R0602 Shortness of breath: Secondary | ICD-10-CM

## 2019-03-09 DIAGNOSIS — R059 Cough, unspecified: Secondary | ICD-10-CM

## 2019-03-09 DIAGNOSIS — Z20822 Contact with and (suspected) exposure to covid-19: Secondary | ICD-10-CM

## 2019-03-09 DIAGNOSIS — R05 Cough: Secondary | ICD-10-CM

## 2019-03-09 MED ORDER — ALBUTEROL SULFATE HFA 108 (90 BASE) MCG/ACT IN AERS: 2.0000 | INHALATION_SPRAY | RESPIRATORY_TRACT | 2 refills | Status: DC | PRN

## 2019-03-09 MED ORDER — BENZONATATE 100 MG PO CAPS: 100.0000 mg | ORAL_CAPSULE | Freq: Three times a day (TID) | ORAL | 0 refills | Status: DC | PRN

## 2019-03-09 NOTE — Progress Notes (Signed)
Greater than 5 minutes, yet less than 10 minutes of time have been spent researching, coordinating, and implementing care for this patient today.  Thank you for the details you included in the comment boxes. Those details are very helpful in determining the best course of treatment for you and help [place] to provide the best care.  WARNING: If you are in distress, please call 911 or go to the Emergency Department (if you cannot breathe).   E-Visit for Corona Virus Screening   Your current symptoms could be consistent with the coronavirus.  Call your health care provider or local health department to request and arrange formal testing. Many health care providers can now test patients at their office but not all are.  Please quarantine yourself while awaiting your test results.  [place] [phone], [place], [place] [phone] or visit [email or web address]  and You have been enrolled in [place] for COVID-19.  Daily you will receive a questionnaire within the [facility] website. Our COVID-19 response team will be monitoring your responses daily.    COVID-19 is a respiratory illness with symptoms that are similar to the flu. Symptoms are typically mild to moderate, but there have been cases of severe illness and death due to the virus. The following symptoms may appear 2-14 days after exposure: . Fever . Cough . Shortness of breath or difficulty breathing . Chills . Repeated shaking with chills . Muscle pain . Headache . Sore throat . New loss of taste or smell . Fatigue . Congestion or runny nose . Nausea or vomiting . Diarrhea  It is vitally important that if you feel that you have an infection such as this virus or any other virus that you stay home and away from places where you may spread it to others.  You should self-quarantine for  14 days if you have symptoms that could potentially be coronavirus or have been in close contact a with a person diagnosed with COVID-19 within the last 2 weeks. You should avoid contact with people age 56 and older.   You should wear a mask or cloth face covering over your nose and mouth if you must be around other people or animals, including pets (even at home). Try to stay at least 6 feet away from other people. This will protect the people around you.  You can use medication such as A prescription cough medication called Tessalon Perles 100 mg. You may take 1-2 capsules every 8 hours as needed for cough and A prescription inhaler called Albuterol MDI 90 mcg /actuation 2 puffs every 4 hours as needed for shortness of breath, wheezing, cough  You may also take acetaminophen (Tylenol) as needed for fever.   Reduce your risk of any infection by using the same precautions used for avoiding the common cold or flu:  Marland Kitchen Wash your hands often with soap and warm water for at least 20 seconds.  If soap and water are not readily available, use an alcohol-based hand sanitizer with at least 60% alcohol.  . If coughing or sneezing, cover your mouth and nose by coughing or sneezing into the elbow areas of your shirt or coat, into a tissue or into your sleeve (not your hands). . Avoid shaking hands with others and consider head nods or verbal greetings only. . Avoid touching your eyes, nose, or mouth with unwashed hands.  . Avoid close contact with people who are sick. . Avoid places or events  with large numbers of people in one location, like concerts or sporting events. . Carefully consider travel plans you have or are making. . If you are planning any travel outside or inside the [place], visit the CDC's Travelers' Health webpage for the latest health notices. . If you have some symptoms but not all symptoms, continue to monitor at home and seek medical attention if your symptoms worsen. . If you are having a  medical emergency, call 911.  HOME CARE . Only take medications as instructed by your medical team. . Drink plenty of fluids and get plenty of rest. . A steam or ultrasonic humidifier can help if you have congestion.   GET HELP RIGHT AWAY IF YOU HAVE EMERGENCY WARNING SIGNS** FOR COVID-19. If you or someone is showing any of these signs seek emergency medical care immediately. Call 911 or proceed to your closest emergency facility if: . You develop worsening high fever. . Trouble breathing . Bluish lips or face . Persistent pain or pressure in the chest . New confusion . Inability to wake or stay awake . You cough up blood. . Your symptoms become more severe  **This list is not all possible symptoms. Contact your medical provider for any symptoms that are sever or concerning to you.   MAKE SURE YOU   Understand these instructions.  Will watch your condition.  Will get help right away if you are not doing well or get worse.  Your e-visit answers were reviewed by a board certified advanced clinical practitioner to complete your personal care plan.  Depending on the condition, your plan could have included both over the counter or prescription medications.  If there is a problem please reply once you have received a response from your provider.  Your safety is important to us.  If you have drug allergies check your prescription carefully.    You can use MyChart to ask questions about today's visit, request a non-urgent call back, or ask for a work or school excuse for 24 hours related to this e-Visit. If it has been greater than 24 hours you will need to follow up with your provider, or enter a new e-Visit to address those concerns. You will get an e-mail in the next two days asking about your experience.  I hope that your e-visit has been valuable and will speed your recovery. Thank you for using e-visits.

## 2019-03-09 NOTE — Progress Notes (Signed)
    Subjective:    Patient ID: Katie Joyce, female    DOB: June 29, 1961, 58 y.o.   MRN: [id number]   HPI:. Katie Joyce is a 58 y.o. female presenting for fever, cough, dyspnea and sinus congestion as well as sore throat. Concerned for expoosure to [place]. No known contact has tested positive. She and her son have symptoms that are similar and he has test ordered for tomorrow. Pt. Requests order for test.   Depression screen [facility] 2/9 12/20/2017 11/19/2017 06/30/2017 06/21/2017 06/04/2017  Decreased Interest 0 0 0 0 0  Down, Depressed, Hopeless 0 0 0 0 0  PHQ - 2 Score 0 0 0 0 0     Relevant past medical, surgical, family and social history reviewed and updated as indicated.  Interim medical history since our last visit reviewed. Allergies and medications reviewed and updated.  ROS:  Review of Systems  Constitutional: Positive for fever. Negative for appetite change, chills, diaphoresis and fatigue.  HENT: Positive for congestion, sinus pain and sore throat. Negative for ear pain, hearing loss, postnasal drip, rhinorrhea and trouble swallowing.   Respiratory: Positive for cough and shortness of breath. Negative for chest tightness.   Cardiovascular: Negative for chest pain and palpitations.  Gastrointestinal: Negative for abdominal pain.  Musculoskeletal: Negative for arthralgias.  Skin: Negative for rash.    Negative except as noted in HPI Social History   Tobacco Use  Smoking Status Current Every Day Smoker  . Packs/day: 0.50  . Years: 40.00  . Pack years: 20.00  . Types: Cigarettes  Smokeless Tobacco Never Used       Objective:     Wt Readings from Last 3 Encounters:  08/09/18 218 lb (98.9 kg)  12/20/17 217 lb 6.4 oz (98.6 kg)  11/29/17 219 lb (99.3 kg)     Exam deferred. Pt. Harboring due to COVID 19. Phone visit performed.   Assessment & Plan:   1. Viral URI     No orders of the defined types were placed in this encounter.   No orders of the defined  types were placed in this encounter.     Diagnoses and all orders for this visit:  Viral URI    Virtual Visit via telephone Note  I discussed the limitations, risks, security and privacy concerns of performing an evaluation and management service by telephone and the availability of in person appointments. The patient was identified with two identifiers. Pt.expressed understanding and agreed to proceed. Pt. Is at home. Dr. Livia Snellen is in his office.  Follow Up Instructions:   I discussed the assessment and treatment plan with the patient. The patient was provided an opportunity to ask questions and all were answered. The patient agreed with the plan and demonstrated an understanding of the instructions.   The patient was advised to call back or seek an in-person evaluation if the symptoms worsen or if the condition fails to improve as anticipated. Pt. To call in AM for COVID test order to be sent to testing center in [place].  Total minutes including chart review and phone contact time: 17   Follow up plan: Return if symptoms worsen or fail to improve.  Claretta Fraise, MD [place]

## 2019-03-10 ENCOUNTER — Telehealth: Payer: Self-pay

## 2019-03-10 ENCOUNTER — Encounter (INDEPENDENT_AMBULATORY_CARE_PROVIDER_SITE_OTHER): Payer: Self-pay

## 2019-03-10 ENCOUNTER — Telehealth: Payer: Self-pay | Admitting: *Deleted

## 2019-03-10 ENCOUNTER — Other Ambulatory Visit: Payer: Self-pay

## 2019-03-10 DIAGNOSIS — R6889 Other general symptoms and signs: Secondary | ICD-10-CM | POA: Diagnosis not present

## 2019-03-10 DIAGNOSIS — Z20822 Contact with and (suspected) exposure to covid-19: Secondary | ICD-10-CM

## 2019-03-10 NOTE — Telephone Encounter (Signed)
Pt reports that she had a telephone visit with Dr Cherrie Distance yesterday and needs covid testing. Appt made today @ [facility] @ 11:00. Her son is also sick and being tested at the same time.

## 2019-03-10 NOTE — Telephone Encounter (Signed)
[facility] Courtesy call  - regarding COVID19 Questionnaire  Patient reports SOB is from coughing so much causing her ribs to hurt at times but she is not wheezing nor having labored breathing; Cough - producing yellow/green sputum now; Vomiting - from coughing.  Patient advised to contact PCP regarding productive cough; use Tessalon Perles and inhaler as directed; continue to push fluids.  Patient stated she understood instructions - no further questions.

## 2019-03-11 ENCOUNTER — Encounter (INDEPENDENT_AMBULATORY_CARE_PROVIDER_SITE_OTHER): Payer: Self-pay

## 2019-03-14 ENCOUNTER — Ambulatory Visit: Payer: BLUE CROSS/BLUE SHIELD | Admitting: Family Medicine

## 2019-03-17 ENCOUNTER — Other Ambulatory Visit: Payer: Self-pay

## 2019-03-17 ENCOUNTER — Encounter: Payer: Self-pay | Admitting: Family Medicine

## 2019-03-17 ENCOUNTER — Encounter: Payer: Self-pay | Admitting: Family

## 2019-03-17 ENCOUNTER — Ambulatory Visit (INDEPENDENT_AMBULATORY_CARE_PROVIDER_SITE_OTHER): Payer: BC Managed Care – PPO | Admitting: Family

## 2019-03-17 DIAGNOSIS — F17200 Nicotine dependence, unspecified, uncomplicated: Secondary | ICD-10-CM

## 2019-03-17 DIAGNOSIS — J208 Acute bronchitis due to other specified organisms: Secondary | ICD-10-CM | POA: Diagnosis not present

## 2019-03-17 DIAGNOSIS — B9689 Other specified bacterial agents as the cause of diseases classified elsewhere: Secondary | ICD-10-CM | POA: Diagnosis not present

## 2019-03-17 LAB — NOVEL CORONAVIRUS, NAA: SARS-CoV-2, NAA: NOT DETECTED

## 2019-03-17 MED ORDER — PROMETHAZINE-DM 6.25-15 MG/5ML PO SYRP: 5.0000 mL | ORAL_SOLUTION | Freq: Three times a day (TID) | ORAL | 0 refills | Status: DC | PRN

## 2019-03-17 MED ORDER — PREDNISONE 10 MG (21) PO TBPK: ORAL_TABLET | ORAL | 0 refills | Status: DC

## 2019-03-17 MED ORDER — AZITHROMYCIN 250 MG PO TABS: ORAL_TABLET | ORAL | 0 refills | Status: DC

## 2019-03-17 NOTE — Progress Notes (Signed)
Virtual Visit via telephone Note  I connected with Katie Joyce on 03/17/19 at 11:17 AM by telephone and verified that I am speaking with the correct person using two identifiers. Katie Joyce is currently located at home and no one is currently with her during visit. The provider, Evelina Dun, FNP is located in their office at time of visit.  I discussed the limitations, risks, security and privacy concerns of performing an evaluation and management service by telephone and the availability of in person appointments. I also discussed with the patient that there may be a patient responsible charge related to this service. The patient expressed understanding and agreed to proceed.   History and Present Illness:  Pt presents to the office today for cough. She states she was tested for COVID last week and it was negative.  Cough This is a new problem. The current episode started 1 to 4 weeks ago. The problem has been gradually worsening. The problem occurs every few minutes. The cough is non-productive. Associated symptoms include chills, a fever, headaches, myalgias (improved), nasal congestion, postnasal drip, rhinorrhea, a sore throat and wheezing. Pertinent negatives include no ear congestion, ear pain or shortness of breath. The symptoms are aggravated by lying down. Risk factors for lung disease include smoking/tobacco exposure. She has tried rest and OTC cough suppressant for the symptoms. The treatment provided no relief.      Review of Systems  Constitutional: Positive for chills and fever.  HENT: Positive for postnasal drip, rhinorrhea and sore throat. Negative for ear pain.   Respiratory: Positive for cough and wheezing. Negative for shortness of breath.   Musculoskeletal: Positive for myalgias (improved).  Neurological: Positive for headaches.  All other systems reviewed and are negative.    Observations/Objective: No SOB or distress noted, constant coarse  nonproductive cough  Assessment and Plan: 1. Acute bacterial bronchitis - Take meds as prescribed - Use a cool mist humidifier  -Use saline nose sprays frequently -Force fluids -For any cough or congestion  Use plain Mucinex- regular strength or max strength is fine -For fever or aces or pains- take tylenol or ibuprofen. -Throat lozenges if help -RTO if symptoms worsen or do not improve  - azithromycin (ZITHROMAX) 250 MG tablet; Take 500 mg once, then 250 mg for four days  Dispense: 6 tablet; Refill: 0 - predniSONE (STERAPRED UNI-PAK 21 TAB) 10 MG (21) TBPK tablet; Use as directed  Dispense: 21 tablet; Refill: 0 - promethazine-dextromethorphan (PROMETHAZINE-DM) 6.25-15 MG/5ML syrup; Take 5 mLs by mouth 3 (three) times daily as needed for cough.  Dispense: 118 mL; Refill: 0  2. Smoker Smoking cessation discussed      I discussed the assessment and treatment plan with the patient. The patient was provided an opportunity to ask questions and all were answered. The patient agreed with the plan and demonstrated an understanding of the instructions.   The patient was advised to call back or seek an in-person evaluation if the symptoms worsen or if the condition fails to improve as anticipated.  The above assessment and management plan was discussed with the patient. The patient verbalized understanding of and has agreed to the management plan. Patient is aware to call the clinic if symptoms persist or worsen. Patient is aware when to return to the clinic for a follow-up visit. Patient educated on when it is appropriate to go to the emergency department.   Time call ended:  11:28 AM   I provided 11 minutes of non-face-to-face time  during this encounter.    Evelina Dun, FNP

## 2019-03-18 IMAGING — CT CT ABD-PELV W/ CM
2 of 5 series · 16 of 46 positions shown, 18 images · IV contrast (Isovue)
Comparison: 03/15/2017

CLINICAL DATA: Severe sharp umbilical and bilateral flank pain with
diarrhea and hematochezia.

EXAM:
CT ABDOMEN AND PELVIS WITH CONTRAST
TECHNIQUE: Multidetector CT imaging of the abdomen and pelvis was performed
using the standard protocol following bolus administration of
intravenous contrast.
CONTRAST:  100mL BYKUA3-1PP IOPAMIDOL (BYKUA3-1PP) INJECTION 61%

[Series 2: axial st · axial · 0.80mm/px · z∈[-492,-32]mm · 13 of 104 slices shown, 15 images]
[im 6/104  soft-tissue]
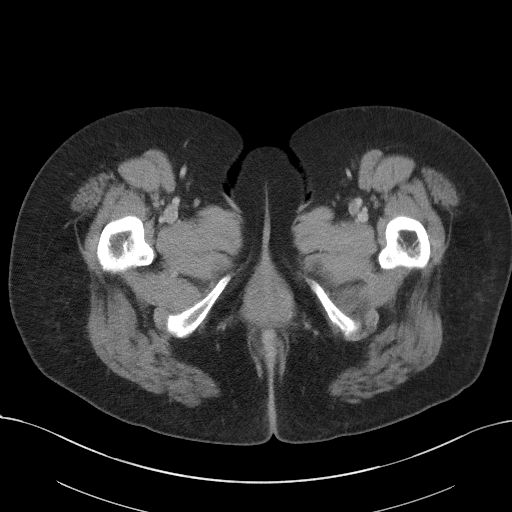
[im 6/104  bone]
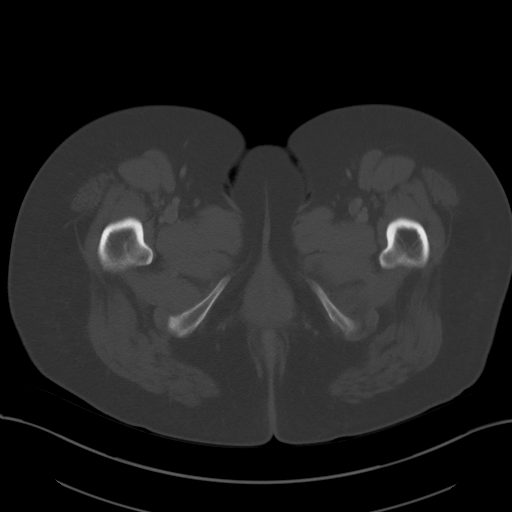
[im 12/104  soft-tissue]
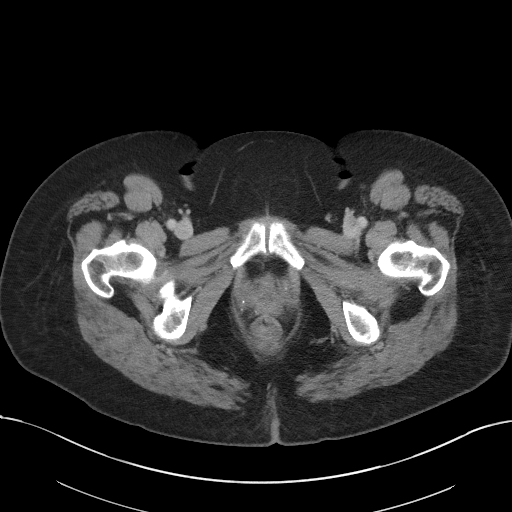
[im 23/104  soft-tissue]
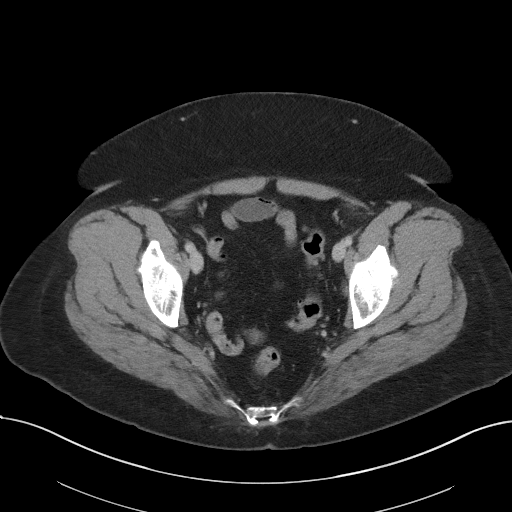
[im 29/104  soft-tissue]
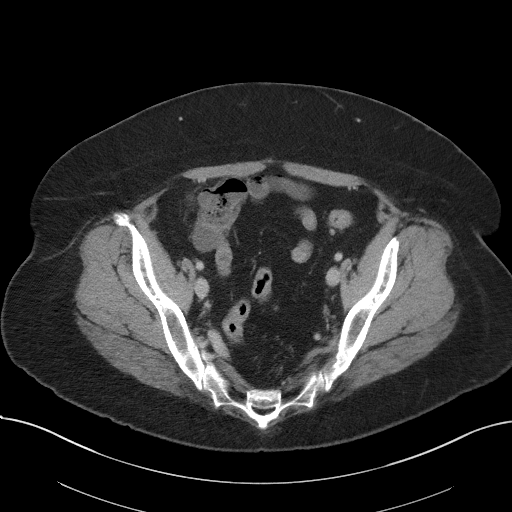
[im 35/104  soft-tissue]
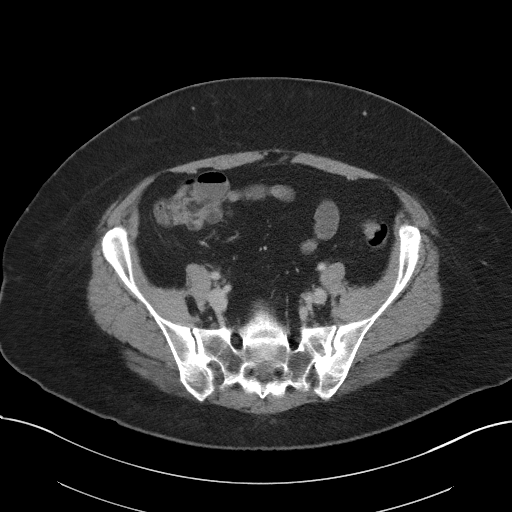
[im 46/104  soft-tissue]
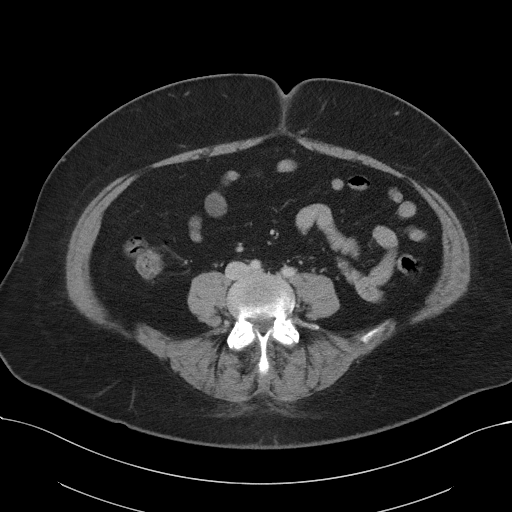
[im 52/104  soft-tissue]
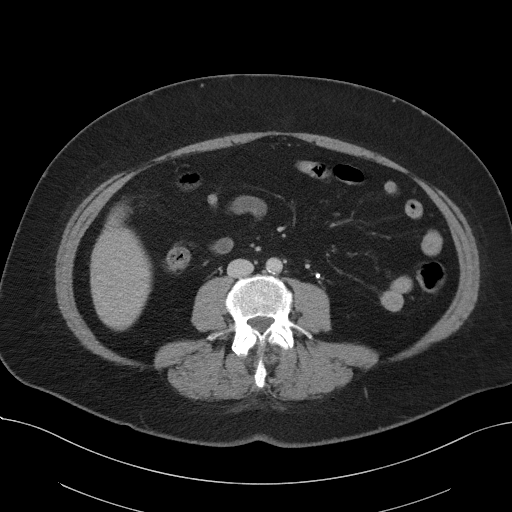
[im 58/104  soft-tissue]
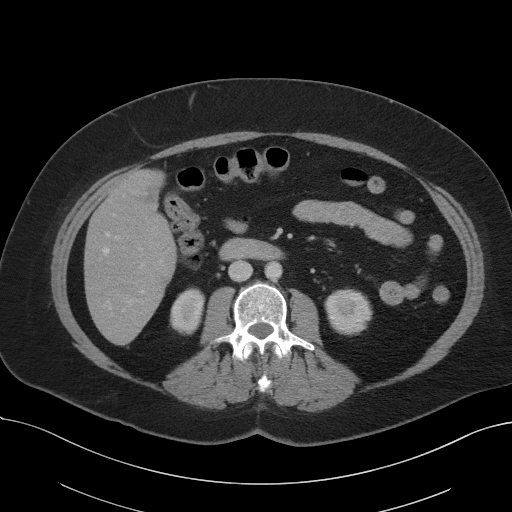
[im 69/104  soft-tissue]
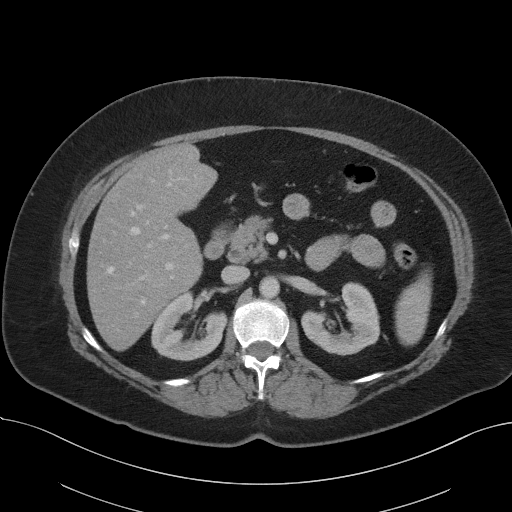
[im 69/104  bone]
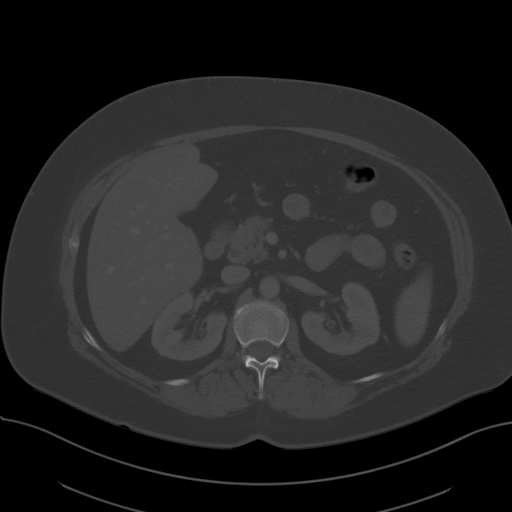
[im 75/104  soft-tissue]
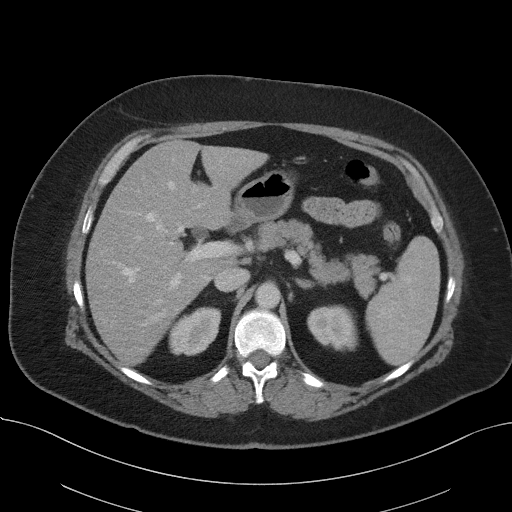
[im 81/104  soft-tissue]
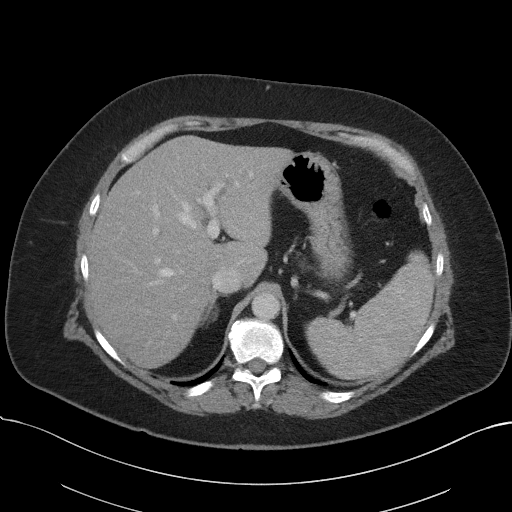
[im 92/104  soft-tissue]
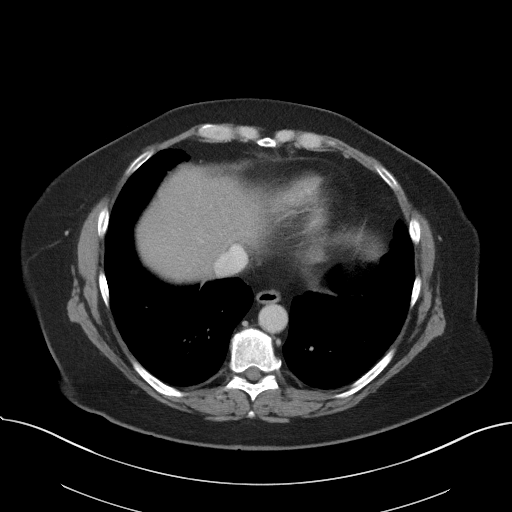
[im 98/104  soft-tissue]
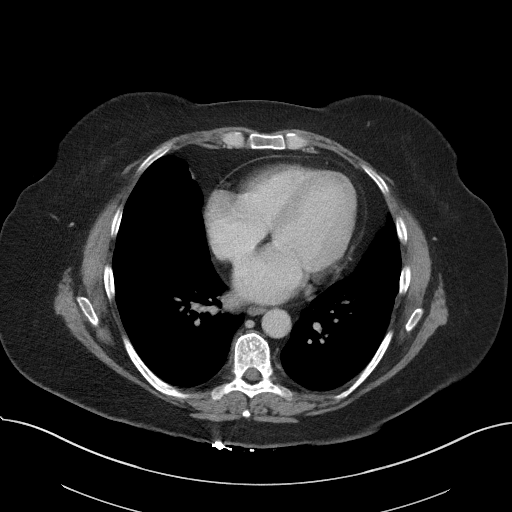

[Series 5: coronal st · coronal · 0.85mm/px · 3 of 113 slices shown]
[im 38/113  soft-tissue]
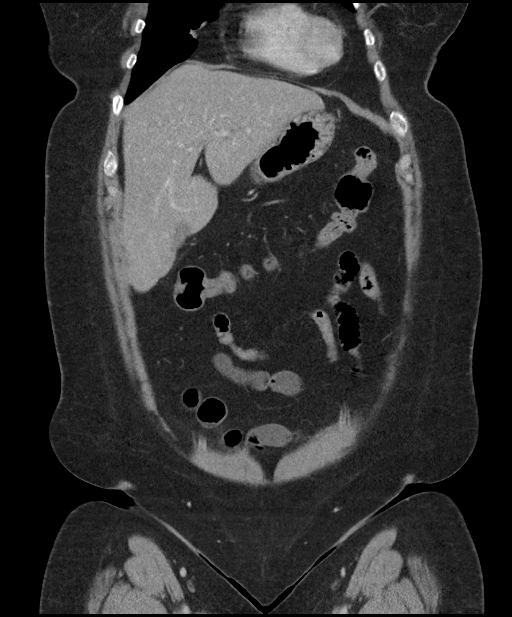
[im 50/113  soft-tissue]
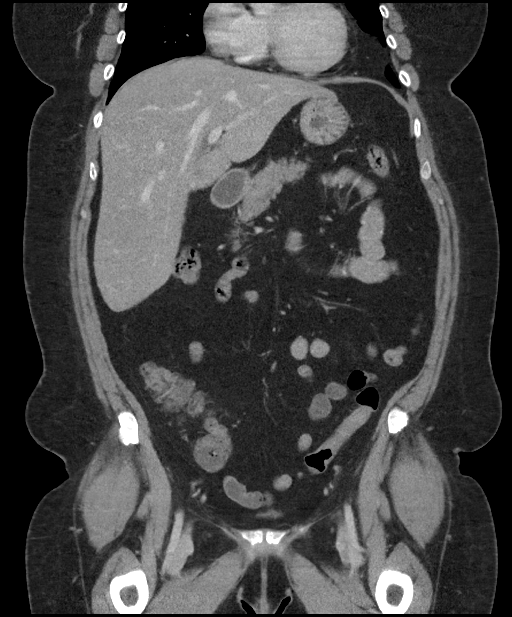
[im 63/113  soft-tissue]
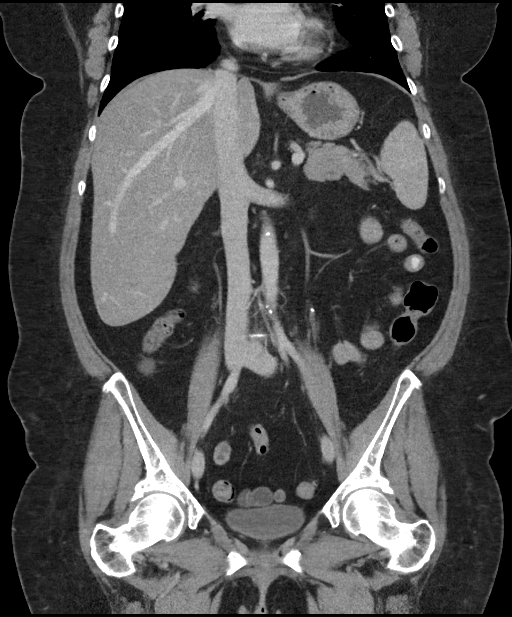

[16 of 46 positions shown; findings below may reference images not displayed]

FINDINGS: Lower chest: Borderline cardiomegaly without pericardial effusion.
There is atelectasis and/or scarring at the lung bases.

Hepatobiliary: Hepatic steatosis without space-occupying mass.
Status post cholecystectomy.

Pancreas: Unremarkable. No pancreatic ductal dilatation or
surrounding inflammatory changes.

Spleen: Normal in size without focal abnormality.

Adrenals/Urinary Tract: Stable 7 mm exophytic left lower pole renal
cyst. No solid enhancing masses of either kidney. Normal bilateral
adrenal glands. No nephrolithiasis nor obstructive uropathy. The
urinary bladder is physiologically distended.

Stomach/Bowel: The stomach is nondistended. There is normal small
bowel rotation with the ligament of Treitz position. No small bowel
dilatation or obstruction. Status post appendectomy. There is
descending and sigmoid diverticulosis without acute diverticulitis.
No evidence of acute colitis. No annular constricting lesions.
Right-sided cecal diverticulum is also seen.

Vascular/Lymphatic: Minimal aortic atherosclerosis. No aneurysm or
dissection. No lymphadenopathy. Stable 8 mm retroperitoneal lymph
node adjacent to the left adrenal gland.

Reproductive: Status post hysterectomy. No adnexal masses.

Other: No abdominal wall hernia or abnormality. No abdominopelvic
ascites.

Musculoskeletal: No acute or significant osseous findings.
IMPRESSION: 1. Colonic diverticulosis without evidence of acute diverticulitis.
No bowel inflammation or obstruction.
2. Hepatic steatosis.  Status post cholecystectomy.
3. Stable 7 mm exophytic left lower pole renal cyst.

## 2019-05-01 DIAGNOSIS — F3110 Bipolar disorder, current episode manic without psychotic features, unspecified: Secondary | ICD-10-CM | POA: Diagnosis not present

## 2019-06-26 ENCOUNTER — Encounter: Payer: Self-pay | Admitting: Family

## 2019-06-26 ENCOUNTER — Ambulatory Visit (INDEPENDENT_AMBULATORY_CARE_PROVIDER_SITE_OTHER): Payer: BC Managed Care – PPO | Admitting: Family

## 2019-06-26 DIAGNOSIS — M25559 Pain in unspecified hip: Secondary | ICD-10-CM

## 2019-06-26 DIAGNOSIS — M5430 Sciatica, unspecified side: Secondary | ICD-10-CM

## 2019-06-26 MED ORDER — DICLOFENAC SODIUM 75 MG PO TBEC: 75.0000 mg | DELAYED_RELEASE_TABLET | Freq: Two times a day (BID) | ORAL | 0 refills | Status: DC

## 2019-06-26 MED ORDER — PREDNISONE 10 MG (21) PO TBPK: ORAL_TABLET | ORAL | 0 refills | Status: DC

## 2019-06-26 MED ORDER — GABAPENTIN 100 MG PO CAPS: 100.0000 mg | ORAL_CAPSULE | Freq: Three times a day (TID) | ORAL | 0 refills | Status: DC | PRN

## 2019-06-26 NOTE — Progress Notes (Signed)
Virtual Visit via telephone Note Due to COVID-19 pandemic this visit was conducted virtually. This visit type was conducted due to national recommendations for restrictions regarding the COVID-19 Pandemic (e.g. social distancing, sheltering in place) in an effort to limit this patient's exposure and mitigate transmission in our community. All issues noted in this document were discussed and addressed.  A physical exam was not performed with this format.  I connected with Katie Joyce on 06/26/19 at 2:32 pm by telephone and verified that I am speaking with the correct person using two identifiers. Katie Joyce is currently located at work and no one is currently with her during visit. The provider, Evelina Dun, FNP is located in their office at time of visit.  I discussed the limitations, risks, security and privacy concerns of performing an evaluation and management service by telephone and the availability of in person appointments. I also discussed with the patient that there may be a patient responsible charge related to this service. The patient expressed understanding and agreed to proceed.   History and Present Illness:  Hip Pain  The incident occurred more than 1 week ago. There was no injury mechanism. The pain is present in the left hip. The quality of the pain is described as aching. The pain is at a severity of 5/10. The pain is moderate. The pain has been intermittent since onset. Associated symptoms include tingling. Pertinent negatives include no loss of motion, muscle weakness or numbness. She reports no foreign bodies present. The symptoms are aggravated by movement and weight bearing. She has tried NSAIDs and rest for the symptoms. The treatment provided mild relief.  Back Pain The current episode started 1 to 4 weeks ago. The problem occurs constantly. The problem has been gradually worsening since onset. The pain is present in the gluteal. Associated symptoms include  tingling. Pertinent negatives include no numbness.      Review of Systems  Musculoskeletal: Positive for back pain and joint pain.  Neurological: Positive for tingling. Negative for numbness.  All other systems reviewed and are negative.    Observations/Objective: No SOB or distress noted   Assessment and Plan: 1. Hip pain - predniSONE (STERAPRED UNI-PAK 21 TAB) 10 MG (21) TBPK tablet; Use as directed  Dispense: 21 tablet; Refill: 0 - diclofenac (VOLTAREN) 75 MG EC tablet; Take 1 tablet (75 mg total) by mouth 2 (two) times daily.  Dispense: 30 tablet; Refill: 0 - gabapentin (NEURONTIN) 100 MG capsule; Take 1 capsule (100 mg total) by mouth 3 (three) times daily as needed.  Dispense: 90 capsule; Refill: 0  2. Sciatic leg pain - predniSONE (STERAPRED UNI-PAK 21 TAB) 10 MG (21) TBPK tablet; Use as directed  Dispense: 21 tablet; Refill: 0 - diclofenac (VOLTAREN) 75 MG EC tablet; Take 1 tablet (75 mg total) by mouth 2 (two) times daily.  Dispense: 30 tablet; Refill: 0 - gabapentin (NEURONTIN) 100 MG capsule; Take 1 capsule (100 mg total) by mouth 3 (three) times daily as needed.  Dispense: 90 capsule; Refill: 0  Rest Ice  ROM exercises encouraged Pt takes motrin at home and tolerates well. Will give Diclofenac BID with food for next 7-10 days. No other NSAID's. Gabapentin TID prn, Sedation precautions discussed      I discussed the assessment and treatment plan with the patient. The patient was provided an opportunity to ask questions and all were answered. The patient agreed with the plan and demonstrated an understanding of the instructions.   The patient  was advised to call back or seek an in-person evaluation if the symptoms worsen or if the condition fails to improve as anticipated.  The above assessment and management plan was discussed with the patient. The patient verbalized understanding of and has agreed to the management plan. Patient is aware to call the clinic if  symptoms persist or worsen. Patient is aware when to return to the clinic for a follow-up visit. Patient educated on when it is appropriate to go to the emergency department.   Time call ended:  2:45 pm   I provided 13 minutes of non-face-to-face time during this encounter.    Jannifer Rodney, FNP

## 2019-07-24 DIAGNOSIS — F3110 Bipolar disorder, current episode manic without psychotic features, unspecified: Secondary | ICD-10-CM | POA: Diagnosis not present

## 2019-08-16 ENCOUNTER — Telehealth: Payer: Self-pay | Admitting: Family

## 2019-08-16 DIAGNOSIS — Z20822 Contact with and (suspected) exposure to covid-19: Secondary | ICD-10-CM

## 2019-08-16 NOTE — Progress Notes (Signed)
E-Visit for Corona Virus Screening   Your current symptoms could be consistent with the coronavirus.  Many health care providers can now test patients at their office but not all are.  [facility] has multiple testing sites. For information on our COVID testing locations and hours go to [email or web address]   Testing Information: The COVID-19 Community Testing sites will begin testing BY APPOINTMENT ONLY starting the week of December 15th and December 16th (see go-live dates by location below).  You can begin scheduling online at [email or web address]  If you do not have access to a smart phone or computer you may call [phone] for an appointment. . In addition, starting the week of December 14th we will move INDOORS for testing (see locations below). . Community testing site appointment hours will be 8 a.m. to 3:30 p.m.   Testing Locations:  Appointments will begin Tuesday December 15th. Closed on Monday December 14th to relocate indoors to [address], [place] [address] [facility] Appointments will begin Wednesday December 16th. Closed on Tuesday December 15th for move to indoors at [address], [place], [place] [address] [facility] Appointments will begin Wednesday December 16th. Closed on Tuesday December 15th for move to indoors at [address], [place] [address]   We are enrolling you in our MyChart Home Monitoring for COVID19 . Daily you will receive a questionnaire within the MyChart website. Our COVID 19 response team will be monitoring your responses daily.  Please quarantine yourself while awaiting your test results. If you develop fever/cough/breathlessness, please stay home for 10 days with improving symptoms and until you have had 24 hours of no fever (without taking a fever reducer).  You should wear a mask or cloth face covering over your nose and mouth if you must be around other people or animals, including pets (even at  home). Try to stay at least 6 feet away from other people. This will protect the people around you.  Please continue good preventive care measures, including:  frequent hand-washing, avoid touching your face, cover coughs/sneezes, stay out of crowds and keep a 6 foot distance from others.  COVID-19 is a respiratory illness with symptoms that are similar to the flu. Symptoms are typically mild to moderate, but there have been cases of severe illness and death due to the virus.   The following symptoms may appear 2-14 days after exposure: . Fever . Cough . Shortness of breath or difficulty breathing . Chills . Repeated shaking with chills . Muscle pain . Headache . Sore throat . New loss of taste or smell . Fatigue . Congestion or runny nose . Nausea or vomiting . Diarrhea  Go to the nearest hospital ED for assessment if fever/cough/breathlessness are severe or illness seems like a threat to life.  It is vitally important that if you feel that you have an infection such as this virus or any other virus that you stay home and away from places where you may spread it to others.  You should avoid contact with people age 65 and older.    You may also take acetaminophen (Tylenol) as needed for fever.  Reduce your risk of any infection by using the same precautions used for avoiding the common cold or flu:  . Wash your hands often with soap and warm water for at least 20 seconds.  If soap and water are not readily available, use an alcohol-based hand sanitizer with at least 60% alcohol.  . If coughing   or sneezing, cover your mouth and nose by coughing or sneezing into the elbow areas of your shirt or coat, into a tissue or into your sleeve (not your hands). . Avoid shaking hands with others and consider head nods or verbal greetings only. . Avoid touching your eyes, nose, or mouth with unwashed hands.  . Avoid close contact with people who are sick. . Avoid places or events with large numbers of  people in one location, like concerts or sporting events. . Carefully consider travel plans you have or are making. . If you are planning any travel outside or inside the [place], visit the CDC's Travelers' Health webpage for the latest health notices. . If you have some symptoms but not all symptoms, continue to monitor at home and seek medical attention if your symptoms worsen. . If you are having a medical emergency, call 911.  HOME CARE . Only take medications as instructed by your medical team. . Drink plenty of fluids and get plenty of rest. . A steam or ultrasonic humidifier can help if you have congestion.   GET HELP RIGHT AWAY IF YOU HAVE EMERGENCY WARNING SIGNS** FOR COVID-19. If you or someone is showing any of these signs seek emergency medical care immediately. Call 911 or proceed to your closest emergency facility if: . You develop worsening high fever. . Trouble breathing . Bluish lips or face . Persistent pain or pressure in the chest . New confusion . Inability to wake or stay awake . You cough up blood. . Your symptoms become more severe  **This list is not all possible symptoms. Contact your medical provider for any symptoms that are sever or concerning to you.  MAKE SURE YOU   Understand these instructions.  Will watch your condition.  Will get help right away if you are not doing well or get worse.  Your e-visit answers were reviewed by a board certified advanced clinical practitioner to complete your personal care plan.  Depending on the condition, your plan could have included both over the counter or prescription medications.  If there is a problem please reply once you have received a response from your provider.  Your safety is important to us.  If you have drug allergies check your prescription carefully.    You can use MyChart to ask questions about today's visit, request a non-urgent call back, or ask for a work or school excuse for 24 hours related to this  e-Visit. If it has been greater than 24 hours you will need to follow up with your provider, or enter a new e-Visit to address those concerns. You will get an e-mail in the next two days asking about your experience.  I hope that your e-visit has been valuable and will speed your recovery. Thank you for using e-visits.  Approximately 5 minutes was spent documenting and reviewing patient's chart.    

## 2019-08-17 ENCOUNTER — Encounter (INDEPENDENT_AMBULATORY_CARE_PROVIDER_SITE_OTHER): Payer: Self-pay

## 2019-08-17 ENCOUNTER — Telehealth: Payer: Self-pay

## 2019-08-17 NOTE — Telephone Encounter (Signed)
Patient advise on sob, weakness, and change appetite per protocol:   If appetite becomes worse: encourage patient to drink fluids as tolerated, work their way up to bland solid food such as crackers, pretzels, soup, bread or applesauce and boiled starches.   If patient is unable to tolerate any foods or liquids, notify PCP.   IF PATIENT DEVELOPS SEVERE VOMITING (MORE THAN 6 TIMES A DAY AND OR >8 HOURS) AND/OR SEVERE ABDOMINAL PAIN ADVISE PATIENT TO CALL 911 AND SEEK TREATMENT IN ED   Shortness of breath is the same: continue to monitor at home   If symptoms become severe, i.e. shortness of breath at rest, gasping for air, wheezing, CALL 911 AND SEEK TREATMENT IN THE ED    IF PATIENT HAS WORSENING WEAKNESS WITH INABILITY TO STAND OR IF PATIENT HAS TO HOLD ON TO SOMETHING TO GET BALANCE, ADVISE PATIENT TO CALL 911 AND SEEK TREATMENT IN ED   Patient states that she is very weak and that she can stand but it very difficult because she is so fatigue. Patient states that she is having some wheezing but it's not severe. Patient will follow up with her pcp for other recommendations.

## 2019-08-18 ENCOUNTER — Ambulatory Visit: Payer: BC Managed Care – PPO | Attending: Internal Medicine

## 2019-08-18 ENCOUNTER — Other Ambulatory Visit: Payer: Self-pay

## 2019-08-18 DIAGNOSIS — Z20828 Contact with and (suspected) exposure to other viral communicable diseases: Secondary | ICD-10-CM | POA: Diagnosis not present

## 2019-08-18 DIAGNOSIS — Z20822 Contact with and (suspected) exposure to covid-19: Secondary | ICD-10-CM

## 2019-08-19 ENCOUNTER — Encounter (INDEPENDENT_AMBULATORY_CARE_PROVIDER_SITE_OTHER): Payer: Self-pay

## 2019-08-19 ENCOUNTER — Telehealth: Payer: Self-pay

## 2019-08-19 LAB — NOVEL CORONAVIRUS, NAA: SARS-CoV-2, NAA: NOT DETECTED

## 2019-08-19 NOTE — Telephone Encounter (Signed)
Called pt and LM on VM and on MyChart to call back to discuss vomiting.

## 2019-08-19 NOTE — Telephone Encounter (Signed)
Called pt and LM to call back to discuss vomiting.

## 2019-08-23 ENCOUNTER — Encounter (INDEPENDENT_AMBULATORY_CARE_PROVIDER_SITE_OTHER): Payer: Self-pay

## 2019-08-28 ENCOUNTER — Encounter (INDEPENDENT_AMBULATORY_CARE_PROVIDER_SITE_OTHER): Payer: Self-pay

## 2019-09-03 ENCOUNTER — Other Ambulatory Visit: Payer: Self-pay | Admitting: Family Medicine

## 2019-09-03 ENCOUNTER — Other Ambulatory Visit: Payer: Self-pay | Admitting: Family

## 2019-09-03 DIAGNOSIS — E8940 Asymptomatic postprocedural ovarian failure: Secondary | ICD-10-CM

## 2019-09-03 DIAGNOSIS — M25559 Pain in unspecified hip: Secondary | ICD-10-CM

## 2019-09-03 DIAGNOSIS — M5430 Sciatica, unspecified side: Secondary | ICD-10-CM

## 2019-09-08 ENCOUNTER — Other Ambulatory Visit: Payer: Self-pay | Admitting: Family Medicine

## 2019-09-08 ENCOUNTER — Telehealth: Payer: Self-pay | Admitting: *Deleted

## 2019-09-08 DIAGNOSIS — E8940 Asymptomatic postprocedural ovarian failure: Secondary | ICD-10-CM

## 2019-09-08 MED ORDER — ESTRADIOL 0.5 MG PO TABS: 0.5000 mg | ORAL_TABLET | Freq: Every day | ORAL | 0 refills | Status: DC

## 2019-09-08 NOTE — Telephone Encounter (Signed)
Changed, only one month supply as she has not been seen in office in over 6 months.

## 2019-09-08 NOTE — Telephone Encounter (Signed)
Premarin Non Preferred by pt insurance-Preferred is Estradiol

## 2019-09-12 NOTE — Telephone Encounter (Signed)
Pt aware and scheduled CPE with [facility] 1/13 at 2:20.

## 2019-09-13 ENCOUNTER — Encounter: Payer: Self-pay | Admitting: Family Medicine

## 2019-09-13 ENCOUNTER — Other Ambulatory Visit: Payer: Self-pay

## 2019-09-13 ENCOUNTER — Ambulatory Visit (INDEPENDENT_AMBULATORY_CARE_PROVIDER_SITE_OTHER): Payer: BC Managed Care – PPO | Admitting: Family Medicine

## 2019-09-13 VITALS — BP 135/87 | HR 91 | Temp 98.6°F | Resp 20 | Ht 63.0 in | Wt 222.0 lb

## 2019-09-13 DIAGNOSIS — E669 Obesity, unspecified: Secondary | ICD-10-CM

## 2019-09-13 DIAGNOSIS — R05 Cough: Secondary | ICD-10-CM

## 2019-09-13 DIAGNOSIS — E782 Mixed hyperlipidemia: Secondary | ICD-10-CM

## 2019-09-13 DIAGNOSIS — Z0000 Encounter for general adult medical examination without abnormal findings: Secondary | ICD-10-CM

## 2019-09-13 DIAGNOSIS — E66812 Obesity, class 2: Secondary | ICD-10-CM

## 2019-09-13 DIAGNOSIS — E8940 Asymptomatic postprocedural ovarian failure: Secondary | ICD-10-CM

## 2019-09-13 DIAGNOSIS — Z1231 Encounter for screening mammogram for malignant neoplasm of breast: Secondary | ICD-10-CM

## 2019-09-13 DIAGNOSIS — F5101 Primary insomnia: Secondary | ICD-10-CM

## 2019-09-13 DIAGNOSIS — E538 Deficiency of other specified B group vitamins: Secondary | ICD-10-CM

## 2019-09-13 DIAGNOSIS — R059 Cough, unspecified: Secondary | ICD-10-CM

## 2019-09-13 DIAGNOSIS — M5430 Sciatica, unspecified side: Secondary | ICD-10-CM

## 2019-09-13 DIAGNOSIS — M25559 Pain in unspecified hip: Secondary | ICD-10-CM

## 2019-09-13 DIAGNOSIS — K219 Gastro-esophageal reflux disease without esophagitis: Secondary | ICD-10-CM

## 2019-09-13 MED ORDER — CYANOCOBALAMIN 1000 MCG/ML IJ SOLN: INTRAMUSCULAR | 11 refills | Status: DC

## 2019-09-13 MED ORDER — MELOXICAM 15 MG PO TABS: 15.0000 mg | ORAL_TABLET | Freq: Every day | ORAL | 3 refills | Status: DC

## 2019-09-13 MED ORDER — OMEPRAZOLE 20 MG PO CPDR: 20.0000 mg | DELAYED_RELEASE_CAPSULE | Freq: Two times a day (BID) | ORAL | 2 refills | Status: DC

## 2019-09-13 MED ORDER — TRAZODONE HCL 100 MG PO TABS: 100.0000 mg | ORAL_TABLET | Freq: Every day | ORAL | 5 refills | Status: DC

## 2019-09-13 MED ORDER — "SYRINGE/NEEDLE (DISP) 25G X 1-1/4"" 3 ML MISC": 3 refills | Status: DC

## 2019-09-13 MED ORDER — ALBUTEROL SULFATE HFA 108 (90 BASE) MCG/ACT IN AERS: 2.0000 | INHALATION_SPRAY | RESPIRATORY_TRACT | 2 refills | Status: DC | PRN

## 2019-09-13 MED ORDER — FENOFIBRATE 145 MG PO TABS: 145.0000 mg | ORAL_TABLET | Freq: Every day | ORAL | 1 refills | Status: DC

## 2019-09-13 NOTE — Progress Notes (Signed)
Subjective:  Patient ID: Katie Joyce, female    DOB: Jan 18, 1961, 59 y.o.   MRN: [id number]  Patient Care Team: Baruch Gouty, FNP as PCP - General (Family Medicine) Gala Romney Cristopher Estimable, MD as Consulting Physician (Gastroenterology)   Chief Complaint:  Annual Exam (no pap )   HPI: Katie Joyce is a 59 y.o. female presenting on 09/13/2019 for Annual Exam (no pap )   59 year old patient who presents today for her annual physical exam without PAP.  Patient has a history of hyperlipidemia that is well controlled with diet and medications.  She does try to watch what she eats.  She does not exercise on a regular basis.  She still works full-time, sometimes 50+ hours per week.  She has a history of PTSD, bipolar disorder, depression, and anxiety.  She is followed by psychiatry and reports she is doing very well on her current medication regimen.  She does have problems with sleep due to her anxiety and PTSD.  She will take Xanax as needed for sleep.  This is provided by her psychiatrist.  She reports that she still has a very hard time falling asleep and sleeping through the night.  She does have acid reflux that is well controlled with omeprazole twice daily.  No cough, dysphagia, voice change, sore throat, choking, hemoptysis, melena, or hematochezia.  No unexplained weight loss or gain.  No fever, night sweats, chills, or fatigue.  She is on hormone replacement therapy post hysterectomy.  Has been on HRT for a while.  Tolerates well.  No chest pain, shortness of breath, leg swelling, palpitations, headache, confusion, weakness, dizziness, or syncope.  She does have vitamin B12 deficiency and was on monthly IM repletion therapy.  States she has not had this in several months.  States she can feel the difference in her energy levels.  She is on vitamin D repletion therapy daily.  She does have chronic hip and sciatica pain.  This was well controlled with diclofenac.  She has been out of the  diclofenac for a few months and has been using over-the-counter Motrin as needed. No new or worsening symptoms. She is due for her mammogram. She declined flu vaccination today.      Relevant past medical, surgical, family, and social history reviewed and updated as indicated.  Allergies and medications reviewed and updated. Date reviewed: Chart in Epic.   Past Medical History:  Diagnosis Date  . Anemia   . Bipolar 1 disorder ([place])   . Degenerative disc disease, lumbar   . GERD (gastroesophageal reflux disease)   . Hypercholesteremia   . Low iron     Past Surgical History:  Procedure Laterality Date  . APPENDECTOMY    . BUNIONECTOMY Bilateral   . CATARACT EXTRACTION Right   . CHOLECYSTECTOMY    . COLONOSCOPY WITH PROPOFOL N/A 07/19/2017   Procedure: COLONOSCOPY WITH PROPOFOL;  Surgeon: Daneil Dolin, MD;  Location: [facility];  Service: Endoscopy;  Laterality: N/A;  10:00am  . ESOPHAGOGASTRODUODENOSCOPY (EGD) WITH PROPOFOL N/A 07/19/2017   Procedure: ESOPHAGOGASTRODUODENOSCOPY (EGD) WITH PROPOFOL;  Surgeon: Daneil Dolin, MD;  Location: [facility];  Service: Endoscopy;  Laterality: N/A;  . EYE SURGERY Right    cataract extraction  . TONSILLECTOMY AND ADENOIDECTOMY    . VAGINAL HYSTERECTOMY      Social History   Socioeconomic History  . Marital status: Married    Spouse name: Not on file  . Number of children:  Not on file  . Years of education: Not on file  . Highest education level: Not on file  Occupational History  . Occupation: Teacher, music  Tobacco Use  . Smoking status: Former Smoker    Packs/day: 0.50    Years: 40.00    Pack years: 20.00    Types: Cigarettes    Quit date: 07/12/2019    Years since quitting: 0.1  . Smokeless tobacco: Never Used  Substance and Sexual Activity  . Alcohol use: No  . Drug use: No  . Sexual activity: Yes    Birth control/protection: Surgical  Other Topics Concern  . Not on file  Social History Narrative   Lives  with husband and brother   Caffeine use:1 cup coffee per day, soda daily   Right handed   Social Determinants of Health   Financial Resource Strain:   . Difficulty of Paying Living Expenses: Not on file  Food Insecurity:   . Worried About Charity fundraiser in the Last Year: Not on file  . Ran Out of Food in the Last Year: Not on file  Transportation Needs:   . Lack of Transportation (Medical): Not on file  . Lack of Transportation (Non-Medical): Not on file  Physical Activity:   . Days of Exercise per Week: Not on file  . Minutes of Exercise per Session: Not on file  Stress:   . Feeling of Stress : Not on file  Social Connections:   . Frequency of Communication with Friends and Family: Not on file  . Frequency of Social Gatherings with Friends and Family: Not on file  . Attends Religious Services: Not on file  . Active Member of Clubs or Organizations: Not on file  . Attends Archivist Meetings: Not on file  . Marital Status: Not on file  Intimate Partner Violence:   . Fear of Current or Ex-Partner: Not on file  . Emotionally Abused: Not on file  . Physically Abused: Not on file  . Sexually Abused: Not on file    Outpatient Encounter Medications as of 09/13/2019  Medication Sig  . ALPRAZolam (XANAX) 0.25 MG tablet Take 0.25 mg at bedtime by mouth.   . Cholecalciferol (VITAMIN D3) 5000 units CAPS Take 1 capsule every evening by mouth.   . diphenhydrAMINE (BENADRYL) 25 mg capsule Take 50 mg at bedtime by mouth.  . fenofibrate (TRICOR) 145 MG tablet Take 1 tablet (145 mg total) by mouth daily. (Needs to be seen before next refill)  . ferrous sulfate 325 (65 FE) MG EC tablet Take 325 mg daily by mouth.   . fexofenadine (ALLEGRA) 180 MG tablet Take 180 mg every evening by mouth.  . Flaxseed, Linseed, (FLAX SEED OIL) 1000 MG CAPS Take 1 capsule every evening by mouth.   . lamoTRIgine (LAMICTAL) 100 MG tablet Take 100 mg every evening by mouth.   Marland Kitchen omeprazole (PRILOSEC)  20 MG capsule Take 1 capsule (20 mg total) by mouth 2 (two) times daily before a meal.  . topiramate (TOPAMAX) 100 MG tablet Take 100 mg every evening by mouth.   . [DISCONTINUED] fenofibrate (TRICOR) 145 MG tablet Take 1 tablet (145 mg total) by mouth daily. (Needs to be seen before next refill)  . [DISCONTINUED] gabapentin (NEURONTIN) 100 MG capsule Take 1 capsule by mouth three times daily as needed  . [DISCONTINUED] omeprazole (PRILOSEC) 20 MG capsule Take 1 capsule (20 mg total) by mouth 2 (two) times daily before a meal.  . albuterol (  VENTOLIN HFA) 108 (90 Base) MCG/ACT inhaler Inhale 2 puffs into the lungs every 4 (four) hours as needed for shortness of breath.  . cyanocobalamin (,VITAMIN B-12,) 1000 MCG/ML injection INJECT ONCE A WEEK FOR 4 WEEKS THEN ONCE MONTHLY  . estradiol (ESTRACE) 0.5 MG tablet Take 1 tablet (0.5 mg total) by mouth daily. (Patient not taking: Reported on 09/13/2019)  . meloxicam (MOBIC) 15 MG tablet Take 1 tablet (15 mg total) by mouth daily.  . SYRINGE-NEEDLE, DISP, 3 ML 25G X 1-1/4" 3 ML MISC Use for Vitamin B 12 injections.  . traZODone (DESYREL) 100 MG tablet Take 1 tablet (100 mg total) by mouth at bedtime.  . [DISCONTINUED] albuterol (VENTOLIN HFA) 108 (90 Base) MCG/ACT inhaler Inhale 2 puffs into the lungs every 4 (four) hours as needed for shortness of breath. (Patient not taking: Reported on 09/13/2019)  . [DISCONTINUED] cyanocobalamin (,VITAMIN B-12,) 1000 MCG/ML injection INJECT ONCE A WEEK FOR 4 WEEKS THEN ONCE MONTHLY (Patient not taking: Reported on 09/13/2019)  . [DISCONTINUED] diclofenac (VOLTAREN) 75 MG EC tablet Take 1 tablet (75 mg total) by mouth 2 (two) times daily.  . [DISCONTINUED] predniSONE (STERAPRED UNI-PAK 21 TAB) 10 MG (21) TBPK tablet Use as directed   No facility-administered encounter medications on file as of 09/13/2019.    Allergies  Allergen Reactions  . Aspirin Anaphylaxis and Swelling  . Bee Venom Anaphylaxis  . Ciprofloxacin  Shortness Of Breath and Palpitations  . Coconut Oil Anaphylaxis  . Fish Allergy Anaphylaxis  . Niacin And Related Rash    Increased bp  . Oatmeal Anaphylaxis  . Peanut-Containing Drug Products Anaphylaxis  . Penicillins Anaphylaxis  . Shellfish Allergy Anaphylaxis  . Clarithromycin Nausea And Vomiting    rash  . Lac Bovis Rash  . Nitrofurantoin Rash  . Sunscreens Rash  . Calcium-Containing Compounds     Rash body, mouth ulcers  . Keflex [Cephalexin] Hives  . Eggs Or Egg-Derived Products Diarrhea  . Other Diarrhea    Melon - bloating  . Pork Allergy Diarrhea  . Prunus Persica Diarrhea    Peach  . Sulfa Antibiotics Rash    Review of Systems  Constitutional: Negative for activity change, appetite change, chills, diaphoresis, fatigue, fever and unexpected weight change.  HENT: Negative.   Eyes: Negative.  Negative for photophobia and visual disturbance.  Respiratory: Negative for cough, chest tightness and shortness of breath.   Cardiovascular: Negative for chest pain, palpitations and leg swelling.  Gastrointestinal: Negative for abdominal pain, blood in stool, constipation, diarrhea, nausea and vomiting.  Endocrine: Negative.   Genitourinary: Negative for decreased urine volume, difficulty urinating, dysuria, frequency, urgency, vaginal bleeding, vaginal discharge and vaginal pain.  Musculoskeletal: Positive for arthralgias and myalgias.  Skin: Negative.  Negative for color change and rash.  Allergic/Immunologic: Negative.   Neurological: Negative for dizziness, tremors, seizures, syncope, facial asymmetry, speech difficulty, weakness, light-headedness, numbness and headaches.  Hematological: Negative.   Psychiatric/Behavioral: Positive for sleep disturbance. Negative for confusion, hallucinations, self-injury and suicidal ideas.  All other systems reviewed and are negative.       Objective:  BP 135/87 (BP Location: Right Wrist, Cuff Size: Small)   Pulse 91   Temp 98.6  F (37 C)   Resp 20   Ht _0  (1.6 m)   Wt 222 lb (100.7 kg)   SpO2 98%   BMI 39.33 kg/m    Wt Readings from Last 3 Encounters:  09/13/19 222 lb (100.7 kg)  08/09/18 218 lb (98.9 kg)  12/20/17 217 lb 6.4 oz (98.6 kg)    Physical Exam Vitals and nursing note reviewed.  Constitutional:      General: She is not in acute distress.    Appearance: Normal appearance. She is well-developed and well-groomed. She is obese. She is not ill-appearing, toxic-appearing or diaphoretic.  HENT:     Head: Normocephalic and atraumatic.     Jaw: There is normal jaw occlusion.     Right Ear: Hearing, tympanic membrane, ear canal and external ear normal.     Left Ear: Hearing, tympanic membrane, ear canal and external ear normal.     Nose: Nose normal.     Mouth/Throat:     Lips: Pink.     Mouth: Mucous membranes are moist.     Pharynx: Oropharynx is clear. Uvula midline.  Eyes:     General: Lids are normal.     Extraocular Movements: Extraocular movements intact.     Conjunctiva/sclera: Conjunctivae normal.     Pupils: Pupils are equal, round, and reactive to light.  Neck:     Thyroid: No thyroid mass, thyromegaly or thyroid tenderness.     Vascular: No carotid bruit or JVD.     Trachea: Trachea and phonation normal.  Cardiovascular:     Rate and Rhythm: Normal rate and regular rhythm.     Chest Wall: PMI is not displaced.     Pulses: Normal pulses.     Heart sounds: Normal heart sounds. No murmur. No friction rub. No gallop.   Pulmonary:     Effort: Pulmonary effort is normal. No respiratory distress.     Breath sounds: Normal breath sounds. No wheezing.  Abdominal:     General: Bowel sounds are normal. There is no distension or abdominal bruit.     Palpations: Abdomen is soft. There is no hepatomegaly or splenomegaly.     Tenderness: There is no abdominal tenderness. There is no right CVA tenderness or left CVA tenderness.     Hernia: No hernia is present.  Musculoskeletal:         General: Normal range of motion.     Cervical back: Normal range of motion and neck supple.     Right lower leg: No edema.     Left lower leg: No edema.  Lymphadenopathy:     Cervical: No cervical adenopathy.  Skin:    General: Skin is warm and dry.     Capillary Refill: Capillary refill takes less than 2 seconds.     Coloration: Skin is not cyanotic, jaundiced or pale.     Findings: No rash.  Neurological:     General: No focal deficit present.     Mental Status: She is alert and oriented to person, place, and time.     Cranial Nerves: Cranial nerves are intact. No cranial nerve deficit.     Sensory: Sensation is intact. No sensory deficit.     Motor: Motor function is intact. No weakness.     Coordination: Coordination is intact. Coordination normal.     Gait: Gait is intact. Gait normal.     Deep Tendon Reflexes: Reflexes are normal and symmetric. Reflexes normal.  Psychiatric:        Attention and Perception: Attention and perception normal.        Mood and Affect: Mood and affect normal.        Speech: Speech normal.        Behavior: Behavior normal. Behavior is cooperative.  Thought Content: Thought content normal. Thought content does not include homicidal or suicidal ideation. Thought content does not include homicidal or suicidal plan.        Cognition and Memory: Cognition and memory normal.        Judgment: Judgment normal.     Results for orders placed or performed in visit on 08/18/19  Novel Coronavirus, NAA ([facility])   Specimen: Nasopharyngeal(NP) swabs in vial transport medium   NASOPHARYNGE  TESTING  Result Value Ref Range   SARS-CoV-2, NAA Not Detected Not Detected       Pertinent labs & imaging results that were available during my care of the patient were reviewed by me and considered in my medical decision making.  Assessment & Plan:  Kyndal was seen today for annual exam.  Diagnoses and all orders for this visit:  Annual physical exam Health  maintenance discussed. Labs pending. Diet and exercise encouraged. Mammogram ordered.  -     CMP14+EGFR -     CBC with Differential/Platelet -     Lipid panel -     Thyroid Panel With TSH -     Vitamin B12 -     MM SCREENING BREAST TOMO BILATERAL; Future  Mixed hyperlipidemia Diet encouraged - increase intake of fresh fruits and vegetables, increase intake of lean proteins. Bake, broil, or grill foods. Avoid fried, greasy, and fatty foods. Avoid fast foods. Increase intake of fiber-rich whole grains. Exercise encouraged - at least 150 minutes per week and advance as tolerated.  Goal BMI < 25. Continue medications as prescribed. Follow up in 3-6 months as discussed.  -     fenofibrate (TRICOR) 145 MG tablet; Take 1 tablet (145 mg total) by mouth daily. (Needs to be seen before next refill) -     Lipid panel  Postsurgical ovarian failure Recently had to change to estradiol due to insurance coverage. Has noticed a big change in vaginal dryness and decreased libido. Pt will give the medication another few weeks to see if beneficial. She has been on the past without resolution of symptoms.   Cough SABA as needed, does well.  -     albuterol (VENTOLIN HFA) 108 (90 Base) MCG/ACT inhaler; Inhale 2 puffs into the lungs every 4 (four) hours as needed for shortness of breath.  Vitamin B12 deficiency Will reinitiate repletion therapy. Labs pending.  -     cyanocobalamin (,VITAMIN B-12,) 1000 MCG/ML injection; INJECT ONCE A WEEK FOR 4 WEEKS THEN ONCE MONTHLY -     CBC with Differential/Platelet -     Vitamin B12 -     SYRINGE-NEEDLE, DISP, 3 ML 25G X 1-1/4" 3 ML MISC; Use for Vitamin B 12 injections.  Hip pain Sciatic leg pain Renal function reviewed, labs pending. Will trial below to see if beneficial. Report any new or worsening symptoms.  -     meloxicam (MOBIC) 15 MG tablet; Take 1 tablet (15 mg total) by mouth daily.  Primary insomnia Sleep hygiene discussed in detail. Will trail below. TSH  pending.  -     traZODone (DESYREL) 100 MG tablet; Take 1 tablet (100 mg total) by mouth at bedtime. -     Thyroid Panel With TSH  Class 2 obesity Diet and exercise encouraged. Labs pending.  -     CMP14+EGFR -     CBC with Differential/Platelet -     Lipid panel -     Thyroid Panel With TSH -     Vitamin B12  Encounter for screening mammogram for malignant neoplasm of breast -     MM SCREENING BREAST TOMO BILATERAL; Future  Gastroesophageal reflux disease without esophagitis No red flags present. Diet discussed. Avoid fried, spicy, fatty, greasy, and acidic foods. Avoid caffeine, nicotine, and alcohol. Do not eat 2-3 hours before bedtime and stay upright for at least 1-2 hours after eating. Eat small frequent meals. Avoid NSAID's like motrin and aleve. Medications as prescribed. Report any new or worsening symptoms. Follow up as discussed or sooner if needed.   -     omeprazole (PRILOSEC) 20 MG capsule; Take 1 capsule (20 mg total) by mouth 2 (two) times daily before a meal.     Continue all other maintenance medications.  Follow up plan: Return in about 6 months (around 03/12/2020), or if symptoms worsen or fail to improve.  Continue healthy lifestyle choices, including diet (rich in fruits, vegetables, and lean proteins, and low in salt and simple carbohydrates) and exercise (at least 30 minutes of moderate physical activity daily).  Educational handout given for vaginal hormone replacement, health maintenance  The above assessment and management plan was discussed with the patient. The patient verbalized understanding of and has agreed to the management plan. Patient is aware to call the clinic if they develop any new symptoms or if symptoms persist or worsen. Patient is aware when to return to the clinic for a follow-up visit. Patient educated on when it is appropriate to go to the emergency department.   Monia Pouch, FNP-C [facility] [phone]

## 2019-09-13 NOTE — Patient Instructions (Addendum)
Health Maintenance, Female Adopting a healthy lifestyle and getting preventive care are important in promoting health and wellness. Ask your health care provider about:  The right schedule for you to have regular tests and exams.  Things you can do on your own to prevent diseases and keep yourself healthy. What should I know about diet, weight, and exercise? Eat a healthy diet   Eat a diet that includes plenty of vegetables, fruits, low-fat dairy products, and lean protein.  Do not eat a lot of foods that are high in solid fats, added sugars, or sodium. Maintain a healthy weight Body mass index (BMI) is used to identify weight problems. It estimates body fat based on height and weight. Your health care provider can help determine your BMI and help you achieve or maintain a healthy weight. Get regular exercise Get regular exercise. This is one of the most important things you can do for your health. Most adults should:  Exercise for at least 150 minutes each week. The exercise should increase your heart rate and make you sweat (moderate-intensity exercise).  Do strengthening exercises at least twice a week. This is in addition to the moderate-intensity exercise.  Spend less time sitting. Even light physical activity can be beneficial. Watch cholesterol and blood lipids Have your blood tested for lipids and cholesterol at 57 years of age, then have this test every 5 years. Have your cholesterol levels checked more often if:  Your lipid or cholesterol levels are high.  You are older than 57 years of age.  You are at high risk for heart disease. What should I know about cancer screening? Depending on your health history and family history, you may need to have cancer screening at various ages. This may include screening for:  Breast cancer.  Cervical cancer.  Colorectal cancer.  Skin cancer.  Lung cancer. What should I know about heart disease, diabetes, and high blood  pressure? Blood pressure and heart disease  High blood pressure causes heart disease and increases the risk of stroke. This is more likely to develop in people who have high blood pressure readings, are of African descent, or are overweight.  Have your blood pressure checked: ? Every 3-5 years if you are 18-39 years of age. ? Every year if you are 40 years old or older. Diabetes Have regular diabetes screenings. This checks your fasting blood sugar level. Have the screening done:  Once every three years after age 40 if you are at a normal weight and have a low risk for diabetes.  More often and at a younger age if you are overweight or have a high risk for diabetes. What should I know about preventing infection? Hepatitis B If you have a higher risk for hepatitis B, you should be screened for this virus. Talk with your health care provider to find out if you are at risk for hepatitis B infection. Hepatitis C Testing is recommended for:  Everyone born from 1945 through 1965.  Anyone with known risk factors for hepatitis C. Sexually transmitted infections (STIs)  Get screened for STIs, including gonorrhea and chlamydia, if: ? You are sexually active and are younger than 57 years of age. ? You are older than 57 years of age and your health care provider tells you that you are at risk for this type of infection. ? Your sexual activity has changed since you were last screened, and you are at increased risk for chlamydia or gonorrhea. Ask your health care provider if   you are at risk.  Ask your health care provider about whether you are at high risk for HIV. Your health care provider may recommend a prescription medicine to help prevent HIV infection. If you choose to take medicine to prevent HIV, you should first get tested for HIV. You should then be tested every 3 months for as long as you are taking the medicine. Pregnancy  If you are about to stop having your period (premenopausal) and  you may become pregnant, seek counseling before you get pregnant.  Take 400 to 800 micrograms (mcg) of folic acid every day if you become pregnant.  Ask for birth control (contraception) if you want to prevent pregnancy. Osteoporosis and menopause Osteoporosis is a disease in which the bones lose minerals and strength with aging. This can result in bone fractures. If you are 29 years old or older, or if you are at risk for osteoporosis and fractures, ask your health care provider if you should:  Be screened for bone loss.  Take a calcium or vitamin D supplement to lower your risk of fractures.  Be given hormone replacement therapy (HRT) to treat symptoms of menopause. Follow these instructions at home: Lifestyle  Do not use any products that contain nicotine or tobacco, such as cigarettes, e-cigarettes, and chewing tobacco. If you need help quitting, ask your health care provider.  Do not use street drugs.  Do not share needles.  Ask your health care provider for help if you need support or information about quitting drugs. Alcohol use  Do not drink alcohol if: ? Your health care provider tells you not to drink. ? You are pregnant, may be pregnant, or are planning to become pregnant.  If you drink alcohol: ? Limit how much you use to 0-1 drink a day. ? Limit intake if you are breastfeeding.  Be aware of how much alcohol is in your drink. In the [place] one drink equals one 12 oz bottle of beer (355 mL), one 5 oz glass of wine (148 mL), or one 1 oz glass of hard liquor (44 mL). General instructions  Schedule regular health, dental, and eye exams.  Stay current with your vaccines.  Tell your health care provider if: ? You often feel depressed. ? You have ever been abused or do not feel safe at home. Summary  Adopting a healthy lifestyle and getting preventive care are important in promoting health and wellness.  Follow your health care provider's instructions about healthy  diet, exercising, and getting tested or screened for diseases.  Follow your health care provider's instructions on monitoring your cholesterol and blood pressure. This information is not intended to replace advice given to you by your health care provider. Make sure you discuss any questions you have with your health care provider. Document Revised: 08/10/2018 Document Reviewed: 08/10/2018 Elsevier Patient Education  2020 [employer] Conjugated Estrogens vaginal cream What is this medicine? CONJUGATED ESTROGENS (CON ju gate ed ESS troe jenz) are a mixture of female hormones. This cream can help relieve symptoms associated with menopause.like vaginal dryness and irritation. This medicine may be used for other purposes; ask your health care provider or pharmacist if you have questions. COMMON BRAND NAME(S): Premarin What should I tell my health care provider before I take this medicine? They need to know if you have any of these conditions:  abnormal vaginal bleeding  blood vessel disease or blood clots  breast, cervical, endometrial, or uterine cancer  dementia  diabetes  gallbladder disease  heart disease or recent heart attack  high blood pressure  high cholesterol  high level of calcium in the blood  hysterectomy  kidney disease  liver disease  migraine headaches  protein C deficiency  protein S deficiency  stroke  systemic lupus erythematosus (SLE)  tobacco smoker  an unusual or allergic reaction to estrogens other medicines, foods, dyes, or preservatives  pregnant or trying to get pregnant  breast-feeding How should I use this medicine? This medicine is for use in the vagina only. Do not take by mouth. Follow the directions on the prescription label. Use at bedtime unless otherwise directed by your doctor or health care professional. Use the special applicator supplied with the cream. Wash hands before and after use. Fill the applicator with the cream  and remove from the tube. Lie on your back, part and bend your knees. Insert the applicator into the vagina and push the plunger to expel the cream into the vagina. Wash the applicator with warm soapy water and rinse well. Use exactly as directed for the complete length of time prescribed. Do not stop using except on the advice of your doctor or health care professional. Talk to your pediatrician regarding the use of this medicine in children. Special care may be needed. A patient package insert for the product will be given with each prescription and refill. Read this sheet carefully each time. The sheet may change frequently. Overdosage: If you think you have taken too much of this medicine contact a poison control center or emergency room at once. NOTE: This medicine is only for you. Do not share this medicine with others. What if I miss a dose? If you miss a dose, use it as soon as you can. If it is almost time for your next dose, use only that dose. Do not use double or extra doses. What may interact with this medicine? Do not take this medicine with any of the following medications:  aromatase inhibitors like aminoglutethimide, anastrozole, exemestane, letrozole, testolactone This medicine may also interact with the following medications:  barbiturates used for inducing sleep or treating seizures  carbamazepine  grapefruit juice  medicines for fungal infections like itraconazole and ketoconazole  raloxifene or tamoxifen  rifabutin  rifampin  rifapentine  ritonavir  some antibiotics used to treat infections  St. John's Wort  warfarin This list may not describe all possible interactions. Give your health care provider a list of all the medicines, herbs, non-prescription drugs, or dietary supplements you use. Also tell them if you smoke, drink alcohol, or use illegal drugs. Some items may interact with your medicine. What should I watch for while using this medicine? Visit  your health care professional for regular checks on your progress. You will need a regular breast and pelvic exam. You should also discuss the need for regular mammograms with your health care professional, and follow his or her guidelines. This medicine can make your body retain fluid, making your fingers, hands, or ankles swell. Your blood pressure can go up. Contact your doctor or health care professional if you feel you are retaining fluid. If you have any reason to think you are pregnant; stop taking this medicine at once and contact your doctor or health care professional. Tobacco smoking increases the risk of getting a blood clot or having a stroke, especially if you are more than 57 years old. You are strongly advised not to smoke. If you wear contact lenses and notice visual changes, or if the lenses begin  to feel uncomfortable, consult your eye care specialist. If you are going to have elective surgery, you may need to stop taking this medicine beforehand. Consult your health care professional for advice prior to scheduling the surgery. What side effects may I notice from receiving this medicine? Side effects that you should report to your doctor or health care professional as soon as possible:  allergic reactions like skin rash, itching or hives, swelling of the face, lips, or tongue  breast tissue changes or discharge  changes in vision  chest pain  confusion, trouble speaking or understanding  dark urine  general ill feeling or flu-like symptoms  light-colored stools  nausea, vomiting  pain, swelling, warmth in the leg  right upper belly pain  severe headaches  shortness of breath  sudden numbness or weakness of the face, arm or leg  trouble walking, dizziness, loss of balance or coordination  unusual vaginal bleeding  yellowing of the eyes or skin Side effects that usually do not require medical attention (report to your doctor or health care professional if  they continue or are bothersome):  hair loss  increased hunger or thirst  increased urination  symptoms of vaginal infection like itching, irritation or unusual discharge  unusually weak or tired This list may not describe all possible side effects. Call your doctor for medical advice about side effects. You may report side effects to FDA at 1-800-FDA-1088. Where should I keep my medicine? Keep out of the reach of children. Store at room temperature between 15 and 30 degrees C (59 and 86 degrees F). Throw away any unused medicine after the expiration date. NOTE: This sheet is a summary. It may not cover all possible information. If you have questions about this medicine, talk to your doctor, pharmacist, or health care provider.  2020 Elsevier/Gold Standard (2010-11-19 09:20:36)

## 2019-09-14 ENCOUNTER — Other Ambulatory Visit: Payer: BC Managed Care – PPO

## 2019-09-14 DIAGNOSIS — E782 Mixed hyperlipidemia: Secondary | ICD-10-CM | POA: Diagnosis not present

## 2019-09-14 DIAGNOSIS — Z0000 Encounter for general adult medical examination without abnormal findings: Secondary | ICD-10-CM | POA: Diagnosis not present

## 2019-09-14 DIAGNOSIS — E669 Obesity, unspecified: Secondary | ICD-10-CM | POA: Diagnosis not present

## 2019-09-14 DIAGNOSIS — E538 Deficiency of other specified B group vitamins: Secondary | ICD-10-CM | POA: Diagnosis not present

## 2019-09-15 LAB — LIPID PANEL
Chol/HDL Ratio: 4.9 ratio — ABNORMAL HIGH (ref 0.0–4.4)
Cholesterol, Total: 204 mg/dL — ABNORMAL HIGH (ref 100–199)
HDL: 42 mg/dL (ref >39)
LDL Chol Calc (NIH): 102 mg/dL — ABNORMAL HIGH (ref 0–99)
Triglycerides: 353 mg/dL — ABNORMAL HIGH (ref 0–149)
VLDL Cholesterol Cal: 60 mg/dL — ABNORMAL HIGH (ref 5–40)

## 2019-09-15 LAB — CBC WITH DIFFERENTIAL/PLATELET
Basophils Absolute: 0.1 10*3/uL (ref 0.0–0.2)
Basos: 1 %
EOS (ABSOLUTE): 0.2 10*3/uL (ref 0.0–0.4)
Eos: 2 %
Hematocrit: 41.2 % (ref 34.0–46.6)
Hemoglobin: 13.5 g/dL (ref 11.1–15.9)
Immature Grans (Abs): 0.2 10*3/uL — ABNORMAL HIGH (ref 0.0–0.1)
Immature Granulocytes: 2 %
Lymphocytes Absolute: 2.9 10*3/uL (ref 0.7–3.1)
Lymphs: 31 %
MCH: 29 pg (ref 26.6–33.0)
MCHC: 32.8 g/dL (ref 31.5–35.7)
MCV: 88 fL (ref 79–97)
Monocytes Absolute: 0.7 10*3/uL (ref 0.1–0.9)
Monocytes: 8 %
Neutrophils Absolute: 5.3 10*3/uL (ref 1.4–7.0)
Neutrophils: 56 %
Platelets: 289 10*3/uL (ref 150–450)
RBC: 4.66 x10E6/uL (ref 3.77–5.28)
RDW: 12.1 % (ref 11.7–15.4)
WBC: 9.4 10*3/uL (ref 3.4–10.8)

## 2019-09-15 LAB — CMP14+EGFR
ALT: 13 IU/L (ref 0–32)
AST: 15 IU/L (ref 0–40)
Albumin/Globulin Ratio: 1.8 (ref 1.2–2.2)
Albumin: 4.3 g/dL (ref 3.8–4.9)
Alkaline Phosphatase: 77 IU/L (ref 39–117)
BUN/Creatinine Ratio: 15 (ref 9–23)
BUN: 15 mg/dL (ref 6–24)
Bilirubin Total: 0.3 mg/dL (ref 0.0–1.2)
CO2: 20 mmol/L (ref 20–29)
Calcium: 9.6 mg/dL (ref 8.7–10.2)
Chloride: 103 mmol/L (ref 96–106)
Creatinine, Ser: 1.02 mg/dL — ABNORMAL HIGH (ref 0.57–1.00)
GFR calc Af Amer: 71 mL/min/{1.73_m2} (ref >59)
GFR calc non Af Amer: 62 mL/min/{1.73_m2} (ref >59)
Globulin, Total: 2.4 g/dL (ref 1.5–4.5)
Glucose: 120 mg/dL — ABNORMAL HIGH (ref 65–99)
Potassium: 4.4 mmol/L (ref 3.5–5.2)
Sodium: 141 mmol/L (ref 134–144)
Total Protein: 6.7 g/dL (ref 6.0–8.5)

## 2019-09-15 LAB — VITAMIN B12: Vitamin B-12: 355 pg/mL (ref 232–1245)

## 2019-09-15 LAB — THYROID PANEL WITH TSH
Free Thyroxine Index: 1.6 (ref 1.2–4.9)
T3 Uptake Ratio: 17 % — ABNORMAL LOW (ref 24–39)
T4, Total: 9.2 ug/dL (ref 4.5–12.0)
TSH: 1.56 u[IU]/mL (ref 0.450–4.500)

## 2019-09-27 DIAGNOSIS — Z1231 Encounter for screening mammogram for malignant neoplasm of breast: Secondary | ICD-10-CM | POA: Diagnosis not present

## 2019-09-29 ENCOUNTER — Telehealth: Payer: Self-pay | Admitting: Family Medicine

## 2019-09-29 DIAGNOSIS — E8940 Asymptomatic postprocedural ovarian failure: Secondary | ICD-10-CM

## 2019-09-29 NOTE — Telephone Encounter (Signed)
Patient has tried Estradiol before and it helped none. Patient wants Premarin sent into [facility]. She also needs a note stating that she needs to work on a lower level floor due to her health conditions at work. Patient wants sent to mychart. Patient aware Marcelino Duster npt back in until Monday. Please advise

## 2019-09-29 NOTE — Telephone Encounter (Signed)
Ok to send in her previous Premarin dose.  I will sign. Thanks.

## 2019-10-02 MED ORDER — ESTROGENS CONJUGATED 0.625 MG PO TABS: 0.6250 mg | ORAL_TABLET | Freq: Every day | ORAL | 1 refills | Status: DC

## 2019-10-02 NOTE — Telephone Encounter (Signed)
Previous dose sent in

## 2019-10-03 ENCOUNTER — Telehealth: Payer: Self-pay | Admitting: *Deleted

## 2019-10-03 NOTE — Telephone Encounter (Addendum)
Prior Auth for Premarin 0.625mg  tab-APPROVED till 10/02/20  Key: [id number] -   PA Case ID: [id number]  Your information has been submitted to [employer]. To check for an updated outcome later, reopen this PA request from your dashboard.  If [employer] has not responded to your request within 24 hours, contact [employer] at [phone]. If you think there may be a problem with your PA request, use our live chat feature at the bottom right.  Pharmacy notified.

## 2019-11-14 DIAGNOSIS — F3110 Bipolar disorder, current episode manic without psychotic features, unspecified: Secondary | ICD-10-CM | POA: Diagnosis not present

## 2019-11-24 DIAGNOSIS — Z23 Encounter for immunization: Secondary | ICD-10-CM | POA: Diagnosis not present

## 2019-12-13 ENCOUNTER — Encounter: Payer: Self-pay | Admitting: *Deleted

## 2019-12-15 DIAGNOSIS — Z23 Encounter for immunization: Secondary | ICD-10-CM | POA: Diagnosis not present

## 2019-12-18 ENCOUNTER — Telehealth: Payer: Self-pay | Admitting: Family Medicine

## 2019-12-18 MED ORDER — ONDANSETRON HCL 4 MG PO TABS: 4.0000 mg | ORAL_TABLET | Freq: Three times a day (TID) | ORAL | 0 refills | Status: DC | PRN

## 2019-12-18 NOTE — Telephone Encounter (Signed)
Patient states she got her second COVID shot Friday and she has been sick since then. States 3 hours after getting the shot she started to have N&V, chills, fever and body aches. Has been using otc ibuprofen.  Her fever finally broke yesterday but patient is still having the chills, body aches, & N&V. States that when she got her first injection she got sick but not as bad as the send shot.  Please advise

## 2019-12-18 NOTE — Telephone Encounter (Signed)
Left message fo patient. Take motrin or tylenol OTC , rest drink plenty of liquids

## 2019-12-18 NOTE — Addendum Note (Signed)
Addended by: Bennie Pierini on: 12/18/2019 06:11 PM   Modules accepted: Orders

## 2020-01-05 DIAGNOSIS — F3110 Bipolar disorder, current episode manic without psychotic features, unspecified: Secondary | ICD-10-CM | POA: Diagnosis not present

## 2020-01-15 DIAGNOSIS — F3110 Bipolar disorder, current episode manic without psychotic features, unspecified: Secondary | ICD-10-CM | POA: Diagnosis not present

## 2020-01-25 ENCOUNTER — Encounter: Payer: Self-pay | Admitting: Family Medicine

## 2020-01-25 DIAGNOSIS — M25559 Pain in unspecified hip: Secondary | ICD-10-CM

## 2020-01-25 DIAGNOSIS — M5430 Sciatica, unspecified side: Secondary | ICD-10-CM

## 2020-01-25 MED ORDER — MELOXICAM 15 MG PO TABS: 15.0000 mg | ORAL_TABLET | Freq: Every day | ORAL | 2 refills | Status: DC

## 2020-02-06 DIAGNOSIS — F3110 Bipolar disorder, current episode manic without psychotic features, unspecified: Secondary | ICD-10-CM | POA: Diagnosis not present

## 2020-02-12 DIAGNOSIS — F3110 Bipolar disorder, current episode manic without psychotic features, unspecified: Secondary | ICD-10-CM | POA: Diagnosis not present

## 2020-02-19 DIAGNOSIS — F3110 Bipolar disorder, current episode manic without psychotic features, unspecified: Secondary | ICD-10-CM | POA: Diagnosis not present

## 2020-02-26 DIAGNOSIS — F3110 Bipolar disorder, current episode manic without psychotic features, unspecified: Secondary | ICD-10-CM | POA: Diagnosis not present

## 2020-03-05 DIAGNOSIS — F3110 Bipolar disorder, current episode manic without psychotic features, unspecified: Secondary | ICD-10-CM | POA: Diagnosis not present

## 2020-03-06 DIAGNOSIS — F3110 Bipolar disorder, current episode manic without psychotic features, unspecified: Secondary | ICD-10-CM | POA: Diagnosis not present

## 2020-03-11 DIAGNOSIS — F3110 Bipolar disorder, current episode manic without psychotic features, unspecified: Secondary | ICD-10-CM | POA: Diagnosis not present

## 2020-03-13 ENCOUNTER — Ambulatory Visit: Payer: BC Managed Care – PPO | Admitting: Family Medicine

## 2020-03-13 ENCOUNTER — Encounter: Payer: Self-pay | Admitting: Family Medicine

## 2020-03-13 ENCOUNTER — Other Ambulatory Visit: Payer: Self-pay

## 2020-03-13 VITALS — BP 134/88 | HR 101 | Temp 97.1°F | Ht 63.0 in | Wt 214.2 lb

## 2020-03-13 DIAGNOSIS — F5101 Primary insomnia: Secondary | ICD-10-CM

## 2020-03-13 DIAGNOSIS — E669 Obesity, unspecified: Secondary | ICD-10-CM

## 2020-03-13 DIAGNOSIS — K219 Gastro-esophageal reflux disease without esophagitis: Secondary | ICD-10-CM | POA: Diagnosis not present

## 2020-03-13 DIAGNOSIS — E538 Deficiency of other specified B group vitamins: Secondary | ICD-10-CM | POA: Diagnosis not present

## 2020-03-13 DIAGNOSIS — M25559 Pain in unspecified hip: Secondary | ICD-10-CM

## 2020-03-13 DIAGNOSIS — E782 Mixed hyperlipidemia: Secondary | ICD-10-CM

## 2020-03-13 DIAGNOSIS — F319 Bipolar disorder, unspecified: Secondary | ICD-10-CM

## 2020-03-13 DIAGNOSIS — M5430 Sciatica, unspecified side: Secondary | ICD-10-CM

## 2020-03-13 DIAGNOSIS — E8940 Asymptomatic postprocedural ovarian failure: Secondary | ICD-10-CM

## 2020-03-13 MED ORDER — FENOFIBRATE 145 MG PO TABS: 145.0000 mg | ORAL_TABLET | Freq: Every day | ORAL | 5 refills | Status: DC

## 2020-03-13 MED ORDER — MELOXICAM 15 MG PO TABS: 15.0000 mg | ORAL_TABLET | Freq: Every day | ORAL | 5 refills | Status: DC

## 2020-03-13 MED ORDER — TRAZODONE HCL 100 MG PO TABS: 100.0000 mg | ORAL_TABLET | Freq: Every day | ORAL | 5 refills | Status: DC

## 2020-03-13 MED ORDER — ESTROGENS CONJUGATED 0.625 MG PO TABS: 0.6250 mg | ORAL_TABLET | Freq: Every day | ORAL | 5 refills | Status: DC

## 2020-03-13 MED ORDER — OMEPRAZOLE 20 MG PO CPDR: 20.0000 mg | DELAYED_RELEASE_CAPSULE | Freq: Two times a day (BID) | ORAL | 5 refills | Status: DC

## 2020-03-13 NOTE — Progress Notes (Signed)
Assessment & Plan:  1. Mixed hyperlipidemia - Patient to continue fenofibrate and flaxseed oil.  Labs today to assess. - fenofibrate (TRICOR) 145 MG tablet; Take 1 tablet (145 mg total) by mouth daily.  Dispense: 30 tablet; Refill: 5 - Lipid panel - CMP14+EGFR  2. Bipolar I disorder ([place]) - Managed by her psychiatrist. - Lipid panel - CMP14+EGFR  3. Postsurgical ovarian failure - Well controlled on current regimen.  - estrogens, conjugated, (PREMARIN) 0.625 MG tablet; Take 1 tablet (0.625 mg total) by mouth daily.  Dispense: 30 tablet; Refill: 5  4. Vitamin B12 deficiency - Well controlled on current regimen.   5. Primary insomnia - Well controlled on current regimen.  - traZODone (DESYREL) 100 MG tablet; Take 1 tablet (100 mg total) by mouth at bedtime.  Dispense: 30 tablet; Refill: 5 - CMP14+EGFR  6. Gastroesophageal reflux disease without esophagitis - Well controlled on current regimen.  - omeprazole (PRILOSEC) 20 MG capsule; Take 1 capsule (20 mg total) by mouth 2 (two) times daily before a meal.  Dispense: 60 capsule; Refill: 5 - CMP14+EGFR  7. Hip pain - Well controlled on current regimen.  - meloxicam (MOBIC) 15 MG tablet; Take 1 tablet (15 mg total) by mouth daily.  Dispense: 30 tablet; Refill: 5 - CMP14+EGFR  8. Sciatic leg pain - Well controlled on current regimen.  - meloxicam (MOBIC) 15 MG tablet; Take 1 tablet (15 mg total) by mouth daily.  Dispense: 30 tablet; Refill: 5 - CMP14+EGFR  9. Obesity (BMI 30-39.9) - Patient has been making dietary modifications and has lost 8 lbs since her last visit. - Lipid panel - CMP14+EGFR   Return in about 6 months (around 09/13/2020) for annual physical.  Hendricks Limes, MSN, APRN, FNP-C [facility]  Subjective:    Patient ID: Katie Joyce, female    DOB: Jan 02, 1961, 59 y.o.   MRN: [id number]  Patient Care Team: Loman Brooklyn, FNP as PCP - General (Family Medicine) Gala Romney Cristopher Estimable,  MD as Consulting Physician (Gastroenterology)   Chief Complaint:  Chief Complaint  Patient presents with  . Establish Care    [facility] patient   . Hyperlipidemia    Check up of chronic medical conditions    HPI: Katie Joyce is a 59 y.o. female presenting on 03/13/2020 for Establish Care ([facility] patient ) and Hyperlipidemia (Check up of chronic medical conditions)  Bipolar disorder is managed by a psychiatrist who prescribes her Xanax, Lamictal, and Topamax.  Patient reports she has been on Premarin for 20 years and understands the risk of taking the medication.  States she used to be on a max dose and has decreased down to her current dose of 0.625 mg daily.  She is unable to decrease further.  Patient uses albuterol due to severe allergies to "everything".  Patient gives herself B12 injections once monthly.  States she has tried taking oral B12, but it did not improve her level at all.  Patient has cut back on sodas to half a can per day for the past 3 weeks.  The only other thing she drinks besides water is 1 cup of coffee per day.  Patient quit smoking in November 2020.  She takes the flaxseed oil with the fenofibrate due to hypertriglyceridemia.  Trazodone helps her sleep at night.    Meloxicam helps control her pain.   Social history:  Relevant past medical, surgical, family and social history reviewed and updated as indicated. Interim medical history since our  last visit reviewed.  Allergies and medications reviewed and updated.  DATA REVIEWED: CHART IN EPIC  ROS: Negative unless specifically indicated above in HPI.    Current Outpatient Medications:  .  albuterol (VENTOLIN HFA) 108 (90 Base) MCG/ACT inhaler, Inhale 2 puffs into the lungs every 4 (four) hours as needed for shortness of breath., Disp: 8 g, Rfl: 2 .  ALPRAZolam (XANAX) 0.25 MG tablet, Take 0.25 mg at bedtime by mouth. , Disp: , Rfl:  .  Cholecalciferol (VITAMIN D3) 5000 units CAPS, Take 1 capsule  every evening by mouth. , Disp: , Rfl:  .  cyanocobalamin (,VITAMIN B-12,) 1000 MCG/ML injection, INJECT ONCE A WEEK FOR 4 WEEKS THEN ONCE MONTHLY, Disp: 6 mL, Rfl: 11 .  diphenhydrAMINE (BENADRYL) 25 mg capsule, Take 50 mg at bedtime by mouth., Disp: , Rfl:  .  estrogens, conjugated, (PREMARIN) 0.625 MG tablet, Take 1 tablet (0.625 mg total) by mouth daily., Disp: 90 tablet, Rfl: 1 .  fenofibrate (TRICOR) 145 MG tablet, Take 1 tablet (145 mg total) by mouth daily. (Needs to be seen before next refill), Disp: 90 tablet, Rfl: 1 .  ferrous sulfate 325 (65 FE) MG EC tablet, Take 325 mg daily by mouth. , Disp: , Rfl:  .  fexofenadine (ALLEGRA) 180 MG tablet, Take 180 mg every evening by mouth., Disp: , Rfl:  .  Flaxseed, Linseed, (FLAX SEED OIL) 1000 MG CAPS, Take 1 capsule every evening by mouth. , Disp: , Rfl:  .  lamoTRIgine (LAMICTAL) 100 MG tablet, Take 100 mg every evening by mouth. , Disp: , Rfl:  .  meloxicam (MOBIC) 15 MG tablet, Take 1 tablet (15 mg total) by mouth daily., Disp: 30 tablet, Rfl: 2 .  omeprazole (PRILOSEC) 20 MG capsule, Take 1 capsule (20 mg total) by mouth 2 (two) times daily before a meal., Disp: 180 capsule, Rfl: 2 .  SYRINGE-NEEDLE, DISP, 3 ML 25G X 1-1/4" 3 ML MISC, Use for Vitamin B 12 injections., Disp: 800 each, Rfl: 3 .  topiramate (TOPAMAX) 100 MG tablet, Take 100 mg every evening by mouth. , Disp: , Rfl:  .  traZODone (DESYREL) 100 MG tablet, Take 1 tablet (100 mg total) by mouth at bedtime., Disp: 30 tablet, Rfl: 5   Allergies  Allergen Reactions  . Aspirin Anaphylaxis and Swelling  . Bee Venom Anaphylaxis  . Ciprofloxacin Shortness Of Breath and Palpitations  . Coconut Oil Anaphylaxis  . Fish Allergy Anaphylaxis  . Niacin And Related Rash    Increased bp  . Oatmeal Anaphylaxis  . Peanut-Containing Drug Products Anaphylaxis  . Penicillins Anaphylaxis  . Shellfish Allergy Anaphylaxis  . Clarithromycin Nausea And Vomiting    rash  . Lac Bovis Rash  .  Nitrofurantoin Rash  . Sunscreens Rash  . Calcium-Containing Compounds     Rash body, mouth ulcers  . Keflex [Cephalexin] Hives  . Eggs Or Egg-Derived Products Diarrhea  . Other Diarrhea    Melon - bloating  . Pork Allergy Diarrhea  . Prunus Persica Diarrhea    Peach  . Sulfa Antibiotics Rash   Past Medical History:  Diagnosis Date  . Anemia   . Bipolar 1 disorder ([place])   . Degenerative disc disease, lumbar   . GERD (gastroesophageal reflux disease)   . Hypercholesteremia   . Low iron     Past Surgical History:  Procedure Laterality Date  . APPENDECTOMY    . BUNIONECTOMY Bilateral   . CATARACT EXTRACTION Right   . CHOLECYSTECTOMY    .  COLONOSCOPY WITH PROPOFOL N/A 07/19/2017   Procedure: COLONOSCOPY WITH PROPOFOL;  Surgeon: Daneil Dolin, MD;  [facility];  Service: Endoscopy;  Laterality: N/A;  10:00am  . ESOPHAGOGASTRODUODENOSCOPY (EGD) WITH PROPOFOL N/A 07/19/2017   Procedure: ESOPHAGOGASTRODUODENOSCOPY (EGD) WITH PROPOFOL;  Surgeon: Daneil Dolin, MD;  [facility];  Service: Endoscopy;  Laterality: N/A;  . EYE SURGERY Right    cataract extraction  . TONSILLECTOMY AND ADENOIDECTOMY    . VAGINAL HYSTERECTOMY      Social History   Socioeconomic History  . Marital status: Married    Spouse name: Not on file  . Number of children: Not on file  . Years of education: Not on file  . Highest education level: Not on file  Occupational History  . Occupation: Teacher, music  Tobacco Use  . Smoking status: Former Smoker    Packs/day: 0.50    Years: 40.00    Pack years: 20.00    Types: Cigarettes    Quit date: 07/12/2019    Years since quitting: 0.6  . Smokeless tobacco: Never Used  Vaping Use  . Vaping Use: Every day  Substance and Sexual Activity  . Alcohol use: No  . Drug use: No  . Sexual activity: Yes    Birth control/protection: Surgical  Other Topics Concern  . Not on file  Social History Narrative   Lives with husband and  brother   Caffeine use:1 cup coffee per day, soda daily   Right handed   Social Determinants of Health   Financial Resource Strain:   . Difficulty of Paying Living Expenses:   Food Insecurity:   . Worried About Charity fundraiser in the Last Year:   . Arboriculturist in the Last Year:   Transportation Needs:   . Film/video editor (Medical):   Marland Kitchen Lack of Transportation (Non-Medical):   Physical Activity:   . Days of Exercise per Week:   . Minutes of Exercise per Session:   Stress:   . Feeling of Stress :   Social Connections:   . Frequency of Communication with Friends and Family:   . Frequency of Social Gatherings with Friends and Family:   . Attends Religious Services:   . Active Member of Clubs or Organizations:   . Attends Archivist Meetings:   Marland Kitchen Marital Status:   Intimate Partner Violence:   . Fear of Current or Ex-Partner:   . Emotionally Abused:   Marland Kitchen Physically Abused:   . Sexually Abused:         Objective:    BP 134/88   Pulse (!) 101   Temp (!) 97.1 F (36.2 C) (Temporal)   Ht 5' 3" (1.6 m)   Wt 214 lb 3.2 oz (97.2 kg)   SpO2 95%   BMI 37.94 kg/m   Wt Readings from Last 3 Encounters:  03/13/20 214 lb 3.2 oz (97.2 kg)  09/13/19 222 lb (100.7 kg)  08/09/18 218 lb (98.9 kg)    Physical Exam Vitals reviewed.  Constitutional:      General: She is not in acute distress.    Appearance: Normal appearance. She is obese. She is not ill-appearing, toxic-appearing or diaphoretic.  HENT:     Head: Normocephalic and atraumatic.  Eyes:     General: No scleral icterus.       Right eye: No discharge.        Left eye: No discharge.     Conjunctiva/sclera: Conjunctivae normal.  Cardiovascular:     Rate and Rhythm: Normal rate and regular rhythm.     Heart sounds: Normal heart sounds. No murmur heard.  No friction rub. No gallop.   Pulmonary:     Effort: Pulmonary effort is normal. No respiratory distress.     Breath sounds: Normal breath  sounds. No stridor. No wheezing, rhonchi or rales.  Musculoskeletal:        General: Normal range of motion.     Cervical back: Normal range of motion.  Skin:    General: Skin is warm and dry.     Capillary Refill: Capillary refill takes less than 2 seconds.  Neurological:     General: No focal deficit present.     Mental Status: She is alert and oriented to person, place, and time. Mental status is at baseline.  Psychiatric:        Mood and Affect: Mood normal.        Behavior: Behavior normal.        Thought Content: Thought content normal.        Judgment: Judgment normal.     Lab Results  Component Value Date   TSH 1.560 09/14/2019   Lab Results  Component Value Date   WBC 9.4 09/14/2019   HGB 13.5 09/14/2019   HCT 41.2 09/14/2019   MCV 88 09/14/2019   PLT 289 09/14/2019   Lab Results  Component Value Date   NA 141 09/14/2019   K 4.4 09/14/2019   CO2 20 09/14/2019   GLUCOSE 120 (H) 09/14/2019   BUN 15 09/14/2019   CREATININE 1.02 (H) 09/14/2019   BILITOT 0.3 09/14/2019   ALKPHOS 77 09/14/2019   AST 15 09/14/2019   ALT 13 09/14/2019   PROT 6.7 09/14/2019   ALBUMIN 4.3 09/14/2019   CALCIUM 9.6 09/14/2019   ANIONGAP 10 07/07/2017   Lab Results  Component Value Date   CHOL 204 (H) 09/14/2019   Lab Results  Component Value Date   HDL 42 09/14/2019   Lab Results  Component Value Date   LDLCALC 102 (H) 09/14/2019   Lab Results  Component Value Date   TRIG 353 (H) 09/14/2019   Lab Results  Component Value Date   CHOLHDL 4.9 (H) 09/14/2019   Lab Results  Component Value Date   HGBA1C 5.6 02/28/2016

## 2020-03-14 LAB — LIPID PANEL
Chol/HDL Ratio: 5.5 ratio — ABNORMAL HIGH (ref 0.0–4.4)
Cholesterol, Total: 214 mg/dL — ABNORMAL HIGH (ref 100–199)
HDL: 39 mg/dL — ABNORMAL LOW (ref >39)
LDL Chol Calc (NIH): 119 mg/dL — ABNORMAL HIGH (ref 0–99)
Triglycerides: 320 mg/dL — ABNORMAL HIGH (ref 0–149)
VLDL Cholesterol Cal: 56 mg/dL — ABNORMAL HIGH (ref 5–40)

## 2020-03-14 LAB — CMP14+EGFR
ALT: 19 IU/L (ref 0–32)
AST: 16 IU/L (ref 0–40)
Albumin/Globulin Ratio: 1.8 (ref 1.2–2.2)
Albumin: 4.4 g/dL (ref 3.8–4.9)
Alkaline Phosphatase: 46 IU/L — ABNORMAL LOW (ref 48–121)
BUN/Creatinine Ratio: 17 (ref 9–23)
BUN: 24 mg/dL (ref 6–24)
Bilirubin Total: 0.2 mg/dL (ref 0.0–1.2)
CO2: 23 mmol/L (ref 20–29)
Calcium: 9.9 mg/dL (ref 8.7–10.2)
Chloride: 105 mmol/L (ref 96–106)
Creatinine, Ser: 1.42 mg/dL — ABNORMAL HIGH (ref 0.57–1.00)
GFR calc Af Amer: 48 mL/min/{1.73_m2} — ABNORMAL LOW (ref >59)
GFR calc non Af Amer: 41 mL/min/{1.73_m2} — ABNORMAL LOW (ref >59)
Globulin, Total: 2.5 g/dL (ref 1.5–4.5)
Glucose: 105 mg/dL — ABNORMAL HIGH (ref 65–99)
Potassium: 4.5 mmol/L (ref 3.5–5.2)
Sodium: 141 mmol/L (ref 134–144)
Total Protein: 6.9 g/dL (ref 6.0–8.5)

## 2020-03-18 DIAGNOSIS — F3110 Bipolar disorder, current episode manic without psychotic features, unspecified: Secondary | ICD-10-CM | POA: Diagnosis not present

## 2020-03-19 DIAGNOSIS — F3110 Bipolar disorder, current episode manic without psychotic features, unspecified: Secondary | ICD-10-CM | POA: Diagnosis not present

## 2020-03-20 ENCOUNTER — Other Ambulatory Visit: Payer: Self-pay

## 2020-03-20 ENCOUNTER — Encounter: Payer: Self-pay | Admitting: Family Medicine

## 2020-03-20 ENCOUNTER — Ambulatory Visit: Payer: BC Managed Care – PPO | Admitting: Family Medicine

## 2020-03-20 VITALS — BP 150/99 | HR 101 | Temp 96.9°F | Ht 63.0 in | Wt 210.0 lb

## 2020-03-20 DIAGNOSIS — N1832 Chronic kidney disease, stage 3b: Secondary | ICD-10-CM | POA: Diagnosis not present

## 2020-03-20 DIAGNOSIS — M25559 Pain in unspecified hip: Secondary | ICD-10-CM

## 2020-03-20 DIAGNOSIS — J302 Other seasonal allergic rhinitis: Secondary | ICD-10-CM

## 2020-03-20 MED ORDER — METHOCARBAMOL 500 MG PO TABS: 500.0000 mg | ORAL_TABLET | Freq: Three times a day (TID) | ORAL | 2 refills | Status: DC | PRN

## 2020-03-20 MED ORDER — FLUTICASONE PROPIONATE 50 MCG/ACT NA SUSP: 2.0000 | Freq: Every day | NASAL | 6 refills | Status: DC

## 2020-03-20 NOTE — Progress Notes (Signed)
Assessment & Plan:  1. Stage 3b chronic kidney disease - D/C meloxicam. Recheck BMP in 3 months. Discussed Marcelline DeistFarxiga, but patient wants to wait to see what her kidney function looks like in 3 months.   2. Hip pain - Rx'd Robaxin for pain. Tramadol if this is not effective; patient does not wish to go this route.  - methocarbamol (ROBAXIN) 500 MG tablet; Take 1 tablet (500 mg total) by mouth 3 (three) times daily as needed for muscle spasms.  Dispense: 60 tablet; Refill: 2  3. Seasonal allergies - fluticasone (FLONASE) 50 MCG/ACT nasal spray; Place 2 sprays into both nostrils daily.  Dispense: 16 g; Refill: 6   Return in about 3 months (around 06/20/2020) for f/u kidneys.  Deliah BostonBritney Tatia Petrucci, MSN, APRN, FNP-C [facility]  Subjective:    Patient ID: Katie Joyce, female    DOB: 03/31/1961, 59 y.o.   MRN: [id number]  Patient Care Team: Gwenlyn FudgeJoyce, Lateria Alderman F, FNP as PCP - General (Family Medicine) Jena Gaussourk, Gerrit Friendsobert M, MD as Consulting Physician (Gastroenterology)   Chief Complaint:  Chief Complaint  Patient presents with  . discuss stopping mobic    HPI: Katie Joyce is a 59 y.o. female presenting on 03/20/2020 for discuss stopping mobic  Patient was advised to stop mobic after labs resulted last week due to declining kidney function. She reports today that Tylenol does not work for her. She has tried gabapentin in the past which was not effective.   New complaints: Patient is requesting Flonase for allergies.   Social history:  Relevant past medical, surgical, family and social history reviewed and updated as indicated. Interim medical history since our last visit reviewed.  Allergies and medications reviewed and updated.  DATA REVIEWED: CHART IN EPIC  ROS: Negative unless specifically indicated above in HPI.    Current Outpatient Medications:  .  albuterol (VENTOLIN HFA) 108 (90 Base) MCG/ACT inhaler, Inhale 2 puffs into the lungs every 4 (four) hours  as needed for shortness of breath., Disp: 8 g, Rfl: 2 .  ALPRAZolam (XANAX) 0.25 MG tablet, Take 0.25 mg at bedtime by mouth. , Disp: , Rfl:  .  Cholecalciferol (VITAMIN D3) 5000 units CAPS, Take 1 capsule every evening by mouth. , Disp: , Rfl:  .  cyanocobalamin (,VITAMIN B-12,) 1000 MCG/ML injection, INJECT ONCE A WEEK FOR 4 WEEKS THEN ONCE MONTHLY, Disp: 6 mL, Rfl: 11 .  diphenhydrAMINE (BENADRYL) 25 mg capsule, Take 50 mg at bedtime by mouth., Disp: , Rfl:  .  estrogens, conjugated, (PREMARIN) 0.625 MG tablet, Take 1 tablet (0.625 mg total) by mouth daily., Disp: 30 tablet, Rfl: 5 .  fenofibrate (TRICOR) 145 MG tablet, Take 1 tablet (145 mg total) by mouth daily., Disp: 30 tablet, Rfl: 5 .  ferrous sulfate 325 (65 FE) MG EC tablet, Take 325 mg daily by mouth. , Disp: , Rfl:  .  fexofenadine (ALLEGRA) 180 MG tablet, Take 180 mg every evening by mouth., Disp: , Rfl:  .  Flaxseed, Linseed, (FLAX SEED OIL) 1000 MG CAPS, Take 1 capsule every evening by mouth. , Disp: , Rfl:  .  lamoTRIgine (LAMICTAL) 100 MG tablet, Take 100 mg every evening by mouth. , Disp: , Rfl:  .  omeprazole (PRILOSEC) 20 MG capsule, Take 1 capsule (20 mg total) by mouth 2 (two) times daily before a meal., Disp: 60 capsule, Rfl: 5 .  SYRINGE-NEEDLE, DISP, 3 ML 25G X 1-1/4" 3 ML MISC, Use for Vitamin B 12 injections., Disp:  800 each, Rfl: 3 .  topiramate (TOPAMAX) 100 MG tablet, Take 100 mg every evening by mouth. , Disp: , Rfl:  .  traZODone (DESYREL) 100 MG tablet, Take 1 tablet (100 mg total) by mouth at bedtime., Disp: 30 tablet, Rfl: 5 .  meloxicam (MOBIC) 15 MG tablet, Take 1 tablet (15 mg total) by mouth daily. (Patient not taking: Reported on 03/20/2020), Disp: 30 tablet, Rfl: 5   Allergies  Allergen Reactions  . Aspirin Anaphylaxis and Swelling  . Bee Venom Anaphylaxis  . Ciprofloxacin Shortness Of Breath and Palpitations  . Coconut Oil Anaphylaxis  . Fish Allergy Anaphylaxis  . Niacin And Related Rash     Increased bp  . Oatmeal Anaphylaxis  . Peanut-Containing Drug Products Anaphylaxis  . Penicillins Anaphylaxis  . Shellfish Allergy Anaphylaxis  . Clarithromycin Nausea And Vomiting    rash  . Lac Bovis Rash  . Nitrofurantoin Rash  . Sunscreens Rash  . Calcium-Containing Compounds     Rash body, mouth ulcers  . Keflex [Cephalexin] Hives  . Eggs Or Egg-Derived Products Diarrhea  . Other Diarrhea    Melon - bloating  . Pork Allergy Diarrhea  . Prunus Persica Diarrhea    Peach  . Sulfa Antibiotics Rash   Past Medical History:  Diagnosis Date  . Anemia   . Bipolar 1 disorder ([facility])   . Degenerative disc disease, lumbar   . GERD (gastroesophageal reflux disease)   . Hypercholesteremia   . Low iron     Past Surgical History:  Procedure Laterality Date  . APPENDECTOMY    . BUNIONECTOMY Bilateral   . CATARACT EXTRACTION Right   . CHOLECYSTECTOMY    . COLONOSCOPY WITH PROPOFOL N/A 07/19/2017   Procedure: COLONOSCOPY WITH PROPOFOL;  Surgeon: Corbin Ade, MD;  [facility];  Service: Endoscopy;  Laterality: N/A;  10:00am  . ESOPHAGOGASTRODUODENOSCOPY (EGD) WITH PROPOFOL N/A 07/19/2017   Procedure: ESOPHAGOGASTRODUODENOSCOPY (EGD) WITH PROPOFOL;  Surgeon: Corbin Ade, MD;  [facility];  Service: Endoscopy;  Laterality: N/A;  . EYE SURGERY Right    cataract extraction  . TONSILLECTOMY AND ADENOIDECTOMY    . VAGINAL HYSTERECTOMY      Social History   Socioeconomic History  . Marital status: Married    Spouse name: Not on file  . Number of children: Not on file  . Years of education: Not on file  . Highest education level: Not on file  Occupational History  . Occupation: Theme park manager  Tobacco Use  . Smoking status: Former Smoker    Packs/day: 0.50    Years: 40.00    Pack years: 20.00    Types: Cigarettes    Quit date: 07/12/2019    Years since quitting: 0.6  . Smokeless tobacco: Never Used  Vaping Use  . Vaping Use: Every day  Substance  and Sexual Activity  . Alcohol use: No  . Drug use: No  . Sexual activity: Yes    Birth control/protection: Surgical  Other Topics Concern  . Not on file  Social History Narrative   Lives with husband and brother   Caffeine use:1 cup coffee per day, soda daily   Right handed   Social Determinants of Health   Financial Resource Strain:   . Difficulty of Paying Living Expenses:   Food Insecurity:   . Worried About Programme researcher, broadcasting/film/video in the Last Year:   . Barista in the Last Year:   Transportation Needs:   .  Lack of Transportation (Medical):   Marland Kitchen Lack of Transportation (Non-Medical):   Physical Activity:   . Days of Exercise per Week:   . Minutes of Exercise per Session:   Stress:   . Feeling of Stress :   Social Connections:   . Frequency of Communication with Friends and Family:   . Frequency of Social Gatherings with Friends and Family:   . Attends Religious Services:   . Active Member of Clubs or Organizations:   . Attends Banker Meetings:   Marland Kitchen Marital Status:   Intimate Partner Violence:   . Fear of Current or Ex-Partner:   . Emotionally Abused:   Marland Kitchen Physically Abused:   . Sexually Abused:         Objective:    BP (!) 150/99   Pulse (!) 101   Temp (!) 96.9 F (36.1 C) (Temporal)   Ht 5\' 3"  (1.6 m)   Wt 210 lb (95.3 kg)   SpO2 96%   BMI 37.20 kg/m   Wt Readings from Last 3 Encounters:  03/20/20 210 lb (95.3 kg)  03/13/20 214 lb 3.2 oz (97.2 kg)  09/13/19 222 lb (100.7 kg)    Physical Exam Vitals reviewed.  Constitutional:      General: She is not in acute distress.    Appearance: Normal appearance. She is obese. She is not ill-appearing, toxic-appearing or diaphoretic.  HENT:     Head: Normocephalic and atraumatic.  Eyes:     General: No scleral icterus.       Right eye: No discharge.        Left eye: No discharge.     Conjunctiva/sclera: Conjunctivae normal.  Cardiovascular:     Rate and Rhythm: Normal rate.  Pulmonary:       Effort: Pulmonary effort is normal. No respiratory distress.  Musculoskeletal:        General: Normal range of motion.     Cervical back: Normal range of motion.  Skin:    General: Skin is warm and dry.     Capillary Refill: Capillary refill takes less than 2 seconds.  Neurological:     General: No focal deficit present.     Mental Status: She is alert and oriented to person, place, and time. Mental status is at baseline.  Psychiatric:        Mood and Affect: Mood normal.        Behavior: Behavior normal.        Thought Content: Thought content normal.        Judgment: Judgment normal.     Lab Results  Component Value Date   TSH 1.560 09/14/2019   Lab Results  Component Value Date   WBC 9.4 09/14/2019   HGB 13.5 09/14/2019   HCT 41.2 09/14/2019   MCV 88 09/14/2019   PLT 289 09/14/2019   Lab Results  Component Value Date   NA 141 03/13/2020   K 4.5 03/13/2020   CO2 23 03/13/2020   GLUCOSE 105 (H) 03/13/2020   BUN 24 03/13/2020   CREATININE 1.42 (H) 03/13/2020   BILITOT 0.2 03/13/2020   ALKPHOS 46 (L) 03/13/2020   AST 16 03/13/2020   ALT 19 03/13/2020   PROT 6.9 03/13/2020   ALBUMIN 4.4 03/13/2020   CALCIUM 9.9 03/13/2020   ANIONGAP 10 07/07/2017   Lab Results  Component Value Date   CHOL 214 (H) 03/13/2020   Lab Results  Component Value Date   HDL 39 (L) 03/13/2020   Lab  Results  Component Value Date   LDLCALC 119 (H) 03/13/2020   Lab Results  Component Value Date   TRIG 320 (H) 03/13/2020   Lab Results  Component Value Date   CHOLHDL 5.5 (H) 03/13/2020   Lab Results  Component Value Date   HGBA1C 5.6 02/28/2016

## 2020-03-25 DIAGNOSIS — F3110 Bipolar disorder, current episode manic without psychotic features, unspecified: Secondary | ICD-10-CM | POA: Diagnosis not present

## 2020-04-01 DIAGNOSIS — F3110 Bipolar disorder, current episode manic without psychotic features, unspecified: Secondary | ICD-10-CM | POA: Diagnosis not present

## 2020-04-09 DIAGNOSIS — F3110 Bipolar disorder, current episode manic without psychotic features, unspecified: Secondary | ICD-10-CM | POA: Diagnosis not present

## 2020-04-17 DIAGNOSIS — Z20822 Contact with and (suspected) exposure to covid-19: Secondary | ICD-10-CM | POA: Diagnosis not present

## 2020-04-19 ENCOUNTER — Telehealth: Payer: BC Managed Care – PPO | Admitting: Physician Assistant

## 2020-04-19 ENCOUNTER — Encounter: Payer: Self-pay | Admitting: Family Medicine

## 2020-04-19 DIAGNOSIS — R112 Nausea with vomiting, unspecified: Secondary | ICD-10-CM

## 2020-04-19 MED ORDER — ONDANSETRON 4 MG PO TBDP
4.0000 mg | ORAL_TABLET | Freq: Three times a day (TID) | ORAL | 0 refills | Status: DC | PRN
Start: 2020-04-19 — End: 2020-12-06

## 2020-04-19 MED ORDER — PROMETHAZINE HCL 25 MG PO TABS: 25.0000 mg | ORAL_TABLET | Freq: Three times a day (TID) | ORAL | 0 refills | Status: DC | PRN

## 2020-04-19 NOTE — Progress Notes (Signed)
We are sorry that you are not feeling well. Here is how we plan to help!  As you await your COVID-19 test result, be sure to isolate yourself from others until you know the result.    If your results come back positive, continue isolation at home, for at least 10 days since the start of your symptoms and until you have had 24 hours with no fever (without taking a fever reducer) and with improving of symptoms.  Please continue good preventive care measures, including:  frequent hand-washing, avoid touching your face, cover coughs/sneezes, stay out of crowds and keep a 6 foot distance from others.  Follow up with your provider or go to the nearest hospital ED for re-assessment if fever/cough/breathlessness return.  Based on what you have shared with me it looks like you have a Virus that is irritating your GI tract.  Vomiting is the forceful emptying of a portion of the stomach's content through the mouth.  Although nausea and vomiting can make you feel miserable, it's important to remember that these are not diseases, but rather symptoms of an underlying illness.  When we treat short term symptoms, we always caution that any symptoms that persist should be fully evaluated in a medical office.  I have prescribed a medication that will help alleviate your symptoms and allow you to stay hydrated:  Promethazine 25 mg take 1 tablet twice daily   You may take acetaminophen (Tylenol) as needed for fever.  HOME CARE:  Drink clear liquids.  This is very important! Dehydration (the lack of fluid) can lead to a serious complication.  Start off with 1 tablespoon every 5 minutes for 8 hours.  You may begin eating bland foods after 8 hours without vomiting.  Start with saltine crackers, white bread, rice, mashed potatoes, applesauce.  After 48 hours on a bland diet, you may resume a normal diet.  Try to go to sleep.  Sleep often empties the stomach and relieves the need to vomit.   The following symptoms  may appear 2-14 days after exposure: . Fever . Cough . Shortness of breath or difficulty breathing . Chills . Repeated shaking with chills . Muscle pain . Headache . Sore throat . New loss of taste or smell . Fatigue . Congestion or runny nose . Nausea or vomiting . Diarrhea  Go to the nearest hospital ED for assessment if fever/cough/breathlessness are severe or illness seems like a threat to life.  It is vitally important that if you feel that you have an infection such as this virus or any other virus that you stay home and away from places where you may spread it to others.  You should avoid contact with people age 57 and older.    Reduce your risk of any infection by using the same precautions used for avoiding the common cold or flu:  Marland Kitchen Wash your hands often with soap and warm water for at least 20 seconds.  If soap and water are not readily available, use an alcohol-based hand sanitizer with at least 60% alcohol.  . If coughing or sneezing, cover your mouth and nose by coughing or sneezing into the elbow areas of your shirt or coat, into a tissue or into your sleeve (not your hands). . Avoid shaking hands with others and consider head nods or verbal greetings only. . Avoid touching your eyes, nose, or mouth with unwashed hands.  . Avoid close contact with people who are sick. . Avoid places or events  with large numbers of people in one location, like concerts or sporting events. . Carefully consider travel plans you have or are making. . If you are planning any travel outside or inside the [place], visit the CDC's Travelers' Health webpage for the latest health notices. . If you have some symptoms but not all symptoms, continue to monitor at home and seek medical attention if your symptoms worsen. . If you are having a medical emergency, call 911.  HOME CARE . Only take medications as instructed by your medical team. . Drink plenty of fluids and get plenty of rest. . A steam or  ultrasonic humidifier can help if you have congestion.   GET HELP RIGHT AWAY IF YOU HAVE EMERGENCY WARNING SIGNS** FOR COVID-19. If you or someone is showing any of these signs seek emergency medical care immediately. Call 911 or proceed to your closest emergency facility if: . You develop worsening high fever. . Trouble breathing . Bluish lips or face . Persistent pain or pressure in the chest . New confusion . Inability to wake or stay awake . You cough up blood. . Your symptoms become more severe     GET HELP RIGHT AWAY IF:   Your symptoms do not improve or worsen within 2 days after treatment.  You have a fever for over 3 days.  You cannot keep down fluids after trying the medication.  MAKE SURE YOU:   Understand these instructions.  Will watch your condition.  Will get help right away if you are not doing well or get worse.   Thank you for choosing an e-visit. Your e-visit answers were reviewed by a board certified advanced clinical practitioner to complete your personal care plan. Depending upon the condition, your plan could have included both over the counter or prescription medications. Please review your pharmacy choice. Be sure that the pharmacy you have chosen is open so that you can pick up your prescription now.  If there is a problem you may message your provider in MyChart to have the prescription routed to another pharmacy. Your safety is important to [place]. If you have drug allergies check your prescription carefully.  For the next 24 hours, you can use MyChart to ask questions about today's visit, request a non-urgent call back, or ask for a work or school excuse from your e-visit provider. You will get an e-mail in the next two days asking about your experience. I hope that your e-visit has been valuable and will speed your recovery.   Greater than 5 minutes, yet less than 10 minutes of time have been spent researching, coordinating and implementing care for  this patient today.

## 2020-04-21 ENCOUNTER — Other Ambulatory Visit: Payer: Self-pay

## 2020-04-21 ENCOUNTER — Emergency Department (HOSPITAL_COMMUNITY)
Admission: EM | Admit: 2020-04-21 | Discharge: 2020-04-22 | Discharge status: Against Medical Advice | Payer: BC Managed Care – PPO | Attending: Emergency Medicine | Admitting: Emergency Medicine

## 2020-04-21 ENCOUNTER — Emergency Department (HOSPITAL_COMMUNITY): Payer: BC Managed Care – PPO

## 2020-04-21 ENCOUNTER — Encounter (HOSPITAL_COMMUNITY): Payer: Self-pay | Admitting: Emergency Medicine

## 2020-04-21 DIAGNOSIS — U071 COVID-19: Secondary | ICD-10-CM | POA: Diagnosis not present

## 2020-04-21 DIAGNOSIS — Z91010 Allergy to peanuts: Secondary | ICD-10-CM | POA: Diagnosis not present

## 2020-04-21 DIAGNOSIS — R000 Tachycardia, unspecified: Secondary | ICD-10-CM | POA: Diagnosis not present

## 2020-04-21 DIAGNOSIS — N1832 Chronic kidney disease, stage 3b: Secondary | ICD-10-CM | POA: Diagnosis not present

## 2020-04-21 DIAGNOSIS — R112 Nausea with vomiting, unspecified: Secondary | ICD-10-CM

## 2020-04-21 DIAGNOSIS — Z87891 Personal history of nicotine dependence: Secondary | ICD-10-CM | POA: Insufficient documentation

## 2020-04-21 DIAGNOSIS — R197 Diarrhea, unspecified: Secondary | ICD-10-CM | POA: Diagnosis not present

## 2020-04-21 LAB — CBC WITH DIFFERENTIAL/PLATELET
Abs Immature Granulocytes: 0.06 10*3/uL (ref 0.00–0.07)
Basophils Absolute: 0 10*3/uL (ref 0.0–0.1)
Basophils Relative: 0 %
Eosinophils Absolute: 0.1 10*3/uL (ref 0.0–0.5)
Eosinophils Relative: 2 %
HCT: 42.8 % (ref 36.0–46.0)
Hemoglobin: 13.4 g/dL (ref 12.0–15.0)
Immature Granulocytes: 1 %
Lymphocytes Relative: 40 %
Lymphs Abs: 2.8 10*3/uL (ref 0.7–4.0)
MCH: 28.7 pg (ref 26.0–34.0)
MCHC: 31.3 g/dL (ref 30.0–36.0)
MCV: 91.6 fL (ref 80.0–100.0)
Monocytes Absolute: 0.5 10*3/uL (ref 0.1–1.0)
Monocytes Relative: 7 %
Neutro Abs: 3.5 10*3/uL (ref 1.7–7.7)
Neutrophils Relative %: 50 %
Platelets: 287 10*3/uL (ref 150–400)
RBC: 4.67 MIL/uL (ref 3.87–5.11)
RDW: 11.9 % (ref 11.5–15.5)
WBC: 6.9 10*3/uL (ref 4.0–10.5)
nRBC: 0 % (ref 0.0–0.2)

## 2020-04-21 LAB — BASIC METABOLIC PANEL
Anion gap: 12 (ref 5–15)
BUN: 21 mg/dL — ABNORMAL HIGH (ref 6–20)
CO2: 22 mmol/L (ref 22–32)
Calcium: 9.9 mg/dL (ref 8.9–10.3)
Chloride: 105 mmol/L (ref 98–111)
Creatinine, Ser: 1.37 mg/dL — ABNORMAL HIGH (ref 0.44–1.00)
GFR calc Af Amer: 50 mL/min — ABNORMAL LOW (ref >60)
GFR calc non Af Amer: 43 mL/min — ABNORMAL LOW (ref >60)
Glucose, Bld: 100 mg/dL — ABNORMAL HIGH (ref 70–99)
Potassium: 4.3 mmol/L (ref 3.5–5.1)
Sodium: 139 mmol/L (ref 135–145)

## 2020-04-21 LAB — SARS CORONAVIRUS 2 BY RT PCR (HOSPITAL ORDER, PERFORMED IN ~~LOC~~ HOSPITAL LAB): SARS Coronavirus 2: POSITIVE — AB

## 2020-04-21 LAB — I-STAT BETA HCG BLOOD, ED (MC, WL, AP ONLY): I-stat hCG, quantitative: <5 m[IU]/mL (ref <5)

## 2020-04-21 NOTE — ED Triage Notes (Signed)
Patient tested positive for COVID19 this week , reports SOB with dry cough , emesis/diarrhea and fatigue .

## 2020-04-22 ENCOUNTER — Other Ambulatory Visit: Payer: Self-pay | Admitting: Unknown Physician Specialty

## 2020-04-22 ENCOUNTER — Telehealth (HOSPITAL_COMMUNITY): Payer: Self-pay | Admitting: Nurse Practitioner

## 2020-04-22 ENCOUNTER — Emergency Department (HOSPITAL_COMMUNITY): Payer: BC Managed Care – PPO

## 2020-04-22 ENCOUNTER — Telehealth: Payer: Self-pay | Admitting: Unknown Physician Specialty

## 2020-04-22 ENCOUNTER — Encounter: Payer: Self-pay | Admitting: Nurse Practitioner

## 2020-04-22 DIAGNOSIS — U071 COVID-19: Secondary | ICD-10-CM

## 2020-04-22 HISTORY — DX: COVID-19: U07.1

## 2020-04-22 MED ORDER — SODIUM CHLORIDE 0.9 % IV BOLUS: 500.0000 mL | Freq: Once | INTRAVENOUS | Status: DC

## 2020-04-22 MED ORDER — PROMETHAZINE HCL 25 MG RE SUPP: 25.0000 mg | Freq: Four times a day (QID) | RECTAL | 0 refills | Status: DC | PRN

## 2020-04-22 MED ORDER — METOCLOPRAMIDE HCL 5 MG/ML IJ SOLN
10.0000 mg | Freq: Once | INTRAMUSCULAR | Status: DC
  Filled 2020-04-22: qty 2

## 2020-04-22 NOTE — ED Provider Notes (Addendum)
[facility] EMERGENCY DEPARTMENT Provider Note   CSN: [id number] Arrival date & time: 04/21/20  2104     History Chief Complaint  Patient presents with  . COVID+ / SOB , Emesis/Diarrhea,Cough    Katie Joyce is a 59 y.o. female.  HPI      Katie Joyce is a 59 y.o. female, with a history of bipolar, GERD, hypercholesterolemia, presenting to the ED with nausea, vomiting, diarrhea for over a week. Chills, fatigue, cough, chest soreness. Her symptoms began August 13.  She tested positive for Covid August 17.  She has been prescribed Zofran ODT and PO Phenergan.  She states, "They just do not stay down." COVID-19 vaccination completed April 2021. Denies syncope, abdominal pain, shortness of breath, or any other complaints.  Past Medical History:  Diagnosis Date  . Anemia   . Bipolar 1 disorder (HCC)   . Degenerative disc disease, lumbar   . GERD (gastroesophageal reflux disease)   . Hypercholesteremia   . Low iron     Patient Active Problem List   Diagnosis Date Noted  . Stage 3b chronic kidney disease 03/20/2020  . Hip pain 09/13/2019  . Sciatic leg pain 09/13/2019  . Primary insomnia 09/13/2019  . Vitamin B12 deficiency 07/13/2017  . Female cystocele 06/21/2017  . Recurrent UTI (urinary tract infection) 06/21/2017  . 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 Bipolar I disorder (HCC) 02/28/2016  . Acid reflux 02/28/2016  . HLD (hyperlipidemia) 02/28/2016  . Obesity (BMI 30-39.9) 02/28/2016    Past Surgical History:  Procedure Laterality Date  . APPENDECTOMY    . BUNIONECTOMY Bilateral   . CATARACT EXTRACTION Right   . CHOLECYSTECTOMY    . COLONOSCOPY WITH PROPOFOL N/A 07/19/2017   Procedure: COLONOSCOPY WITH PROPOFOL;  Surgeon: Corbin Ade, MD;  Location: [facility];  Service: Endoscopy;  Laterality: N/A;  10:00am  . ESOPHAGOGASTRODUODENOSCOPY (EGD) WITH PROPOFOL N/A 07/19/2017   Procedure: ESOPHAGOGASTRODUODENOSCOPY (EGD)  WITH PROPOFOL;  Surgeon: Corbin Ade, MD;  Location: [facility];  Service: Endoscopy;  Laterality: N/A;  . EYE SURGERY Right    cataract extraction  . TONSILLECTOMY AND ADENOIDECTOMY    . VAGINAL HYSTERECTOMY       OB History   No obstetric history on file.     Family History  Problem Relation Age of Onset  . Lung cancer Mother   . Pulmonary embolism Father   . Diabetes Brother   . Hypercholesterolemia Brother   . Hypertension Brother   . Colon cancer Neg Hx   . Inflammatory bowel disease Neg Hx   . Celiac disease Neg Hx     Social History   Tobacco Use  . Smoking status: Former Smoker    Packs/day: 0.50    Years: 40.00    Pack years: 20.00    Types: Cigarettes    Quit date: 07/12/2019    Years since quitting: 0.7  . Smokeless tobacco: Never Used  Vaping Use  . Vaping Use: Every day  Substance Use Topics  . Alcohol use: No  . Drug use: No    Home Medications Prior to Admission medications   Medication Sig Start Date End Date Taking? Authorizing Provider  albuterol (VENTOLIN HFA) 108 (90 Base) MCG/ACT inhaler Inhale 2 puffs into the lungs every 4 (four) hours as needed for shortness of breath. 09/13/19   Sonny Masters, FNP  ALPRAZolam Prudy Feeler) 0.25 MG tablet Take 0.25 mg at bedtime by mouth.  02/18/16  [provider]  Cholecalciferol (VITAMIN D3) 5000 units CAPS Take 1 capsule every evening by mouth.     [provider]  cyanocobalamin (,VITAMIN B-12,) 1000 MCG/ML injection INJECT ONCE A WEEK FOR 4 WEEKS THEN ONCE MONTHLY 09/13/19   Sonny Masters, FNP  diphenhydrAMINE (BENADRYL) 25 mg capsule Take 50 mg at bedtime by mouth.    [provider]  estrogens, conjugated, (PREMARIN) 0.625 MG tablet Take 1 tablet (0.625 mg total) by mouth daily. 03/13/20   Gwenlyn Fudge, FNP  fenofibrate (TRICOR) 145 MG tablet Take 1 tablet (145 mg total) by mouth daily. 03/13/20   Gwenlyn Fudge, FNP  ferrous sulfate 325 (65 FE) MG EC tablet Take 325  mg daily by mouth.     [provider]  fexofenadine (ALLEGRA) 180 MG tablet Take 180 mg every evening by mouth.    [provider]  Flaxseed, Linseed, (FLAX SEED OIL) 1000 MG CAPS Take 1 capsule every evening by mouth.     [provider]  fluticasone (FLONASE) 50 MCG/ACT nasal spray Place 2 sprays into both nostrils daily. 03/20/20   Gwenlyn Fudge, FNP  lamoTRIgine (LAMICTAL) 100 MG tablet Take 100 mg every evening by mouth.  12/06/16   [provider]  methocarbamol (ROBAXIN) 500 MG tablet Take 1 tablet (500 mg total) by mouth 3 (three) times daily as needed for muscle spasms. 03/20/20   Gwenlyn Fudge, FNP  omeprazole (PRILOSEC) 20 MG capsule Take 1 capsule (20 mg total) by mouth 2 (two) times daily before a meal. 03/13/20   Gwenlyn Fudge, FNP  ondansetron (ZOFRAN ODT) 4 MG disintegrating tablet Take 1 tablet (4 mg total) by mouth every 8 (eight) hours as needed for nausea or vomiting. 04/19/20   Gwenlyn Fudge, FNP  promethazine (PHENERGAN) 25 MG suppository Place 1 suppository (25 mg total) rectally every 6 (six) hours as needed for nausea or vomiting. 04/22/20   Kyshon Tolliver C, PA-C  promethazine (PHENERGAN) 25 MG tablet Take 1 tablet (25 mg total) by mouth every 8 (eight) hours as needed for nausea or vomiting. 04/19/20   McVey, Madelaine Bhat, PA-C  SYRINGE-NEEDLE, DISP, 3 ML 25G X 1-1/4" 3 ML MISC Use for Vitamin B 12 injections. 09/13/19   Sonny Masters, FNP  topiramate (TOPAMAX) 100 MG tablet Take 100 mg every evening by mouth.     [provider]  traZODone (DESYREL) 100 MG tablet Take 1 tablet (100 mg total) by mouth at bedtime. 03/13/20   Gwenlyn Fudge, FNP    Allergies    Aspirin, Bee venom, Ciprofloxacin, Coconut oil, Fish allergy, Niacin and related, Oatmeal, Peanut-containing drug products, Penicillins, Shellfish allergy, Clarithromycin, Lac bovis, Nitrofurantoin, Sunscreens, Calcium-containing compounds, Keflex [cephalexin], Eggs  or egg-derived products, Other, Pork allergy, Prunus persica, and Sulfa antibiotics  Review of Systems   Review of Systems  Constitutional: Positive for chills and fatigue.  Respiratory: Positive for cough. Negative for shortness of breath.   Cardiovascular: Negative for leg swelling.  Gastrointestinal: Positive for diarrhea, nausea and vomiting. Negative for abdominal pain and blood in stool.  Neurological: Positive for weakness (generalized). Negative for dizziness, syncope and numbness.  All other systems reviewed and are negative.   Physical Exam Updated Vital Signs BP 124/86 (BP Location: Left Wrist)   Pulse 92   Temp 98.4 F (36.9 C) (Oral)   Resp 18   Ht 5\' 3"  (1.6 m)   Wt 100 kg   SpO2 98%  BMI 39.05 kg/m   Physical Exam Vitals and nursing note reviewed.  Constitutional:      General: She is not in acute distress.    Appearance: She is well-developed. She is not diaphoretic.  HENT:     Head: Normocephalic and atraumatic.     Mouth/Throat:     Mouth: Mucous membranes are moist.     Pharynx: Oropharynx is clear.  Eyes:     Conjunctiva/sclera: Conjunctivae normal.  Cardiovascular:     Rate and Rhythm: Normal rate and regular rhythm.     Pulses: Normal pulses.          Radial pulses are 2+ on the right side and 2+ on the left side.       Posterior tibial pulses are 2+ on the right side and 2+ on the left side.     Heart sounds: Normal heart sounds.     Comments: Tactile temperature in the extremities appropriate and equal bilaterally. Pulmonary:     Effort: Pulmonary effort is normal. No respiratory distress.     Breath sounds: Normal breath sounds.  Abdominal:     Palpations: Abdomen is soft.     Tenderness: There is no abdominal tenderness. There is no guarding.  Musculoskeletal:     Cervical back: Neck supple.     Right lower leg: No edema.     Left lower leg: No edema.  Lymphadenopathy:     Cervical: No cervical adenopathy.  Skin:    General: Skin is  warm and dry.  Neurological:     Mental Status: She is alert.  Psychiatric:        Mood and Affect: Mood and affect normal.        Speech: Speech normal.        Behavior: Behavior normal.     ED Results / Procedures / Treatments   Labs (all labs ordered are listed, but only abnormal results are displayed) Labs Reviewed  SARS CORONAVIRUS 2 BY RT PCR (HOSPITAL ORDER, PERFORMED IN [facility]) - Abnormal; Notable for the following components:      Result Value   SARS Coronavirus 2 POSITIVE (*)    All other components within normal limits  BASIC METABOLIC PANEL - Abnormal; Notable for the following components:   Glucose, Bld 100 (*)    BUN 21 (*)    Creatinine, Ser 1.37 (*)    GFR calc non Af Amer 43 (*)    GFR calc Af Amer 50 (*)    All other components within normal limits  CBC WITH DIFFERENTIAL/PLATELET  I-STAT BETA HCG BLOOD, ED ([facility], [facility], [facility] ONLY)   BUN  Date Value Ref Range Status  04/21/2020 21 (H) 6 - 20 mg/dL Final  98/33/8250 24 6 - 24 mg/dL Final  53/97/6734 15 6 - 24 mg/dL Final  19/37/9024 23 6 - 24 mg/dL Final  09/73/5329 16 6 - 24 mg/dL Final   Creatinine, Ser  Date Value Ref Range Status  04/21/2020 1.37 (H) 0.44 - 1.00 mg/dL Final  92/42/6834 1.96 (H) 0.57 - 1.00 mg/dL Final  22/29/7989 2.11 (H) 0.57 - 1.00 mg/dL Final  94/17/4081 4.48 (H) 0.57 - 1.00 mg/dL Final     EKG EKG Interpretation  Date/Time:  Sunday April 21 2020 21:37:54 EDT Ventricular Rate:  107 PR Interval:  176 QRS Duration: 82 QT Interval:  338 QTC Calculation: 451 R Axis:   54 Text Interpretation: Sinus tachycardia Cannot rule out Anterior infarct , age undetermined Abnormal  ECG Confirmed by Marily Memos [phone]) on 04/22/2020 1:23:56 AM   Radiology DG Chest Portable 1 View  Result Date: 04/22/2020 CLINICAL DATA:  COVID-19 EXAM: PORTABLE CHEST 1 VIEW COMPARISON:  None. FINDINGS: The heart size and mediastinal contours are within normal limits. Both lungs are clear.  The visualized skeletal structures are unremarkable. IMPRESSION: No active disease. Electronically Signed   By: Deatra Robinson M.D.   On: 04/22/2020 02:04    Procedures Procedures (including critical care time)  Medications Ordered in ED Medications  sodium chloride 0.9 % bolus 500 mL (has no administration in time range)  metoCLOPramide (REGLAN) injection 10 mg (has no administration in time range)    ED Course  I have reviewed the triage vital signs and the nursing notes.  Pertinent labs & imaging results that were available during my care of the patient were reviewed by me and considered in my medical decision making (see chart for details).  Clinical Course as of Apr 23 911  Mon Apr 22, 2020  0911 Made contact with Consuello Masse, NP, of the [facility] infusion team via secure chat.  They state they will add the patient to the list for evaluation.   [SJ]    Clinical Course User Index [SJ] Latoyia Tecson, Hillard Danker, PA-C   MDM Rules/Calculators/A&P                          Patient presents with nausea, vomiting, diarrhea. Patient is nontoxic appearing, afebrile, not tachycardic, not tachypneic, not hypotensive, maintains excellent SPO2 on room air, and is in no apparent distress.   I have reviewed the patient's chart to obtain more information.   I reviewed and interpreted the patient's labs and radiological studies. Patient's creatinine consistent with most recent previous value.  Lab work overall reassuring.  Reviewed expected symptoms and symptom duration as well as goals of care with the patient.  I discussed alternative medication options to try to help with her symptoms. Orders were placed for IV antiemetic to help patient symptoms prior to discharge. Nurse entered the patient's room and found the room empty with EKG leads on the bed and patient's belongings gone.  We attempted to call the patient as well as her listed alternate contact with no success.  We also checked the nearby restrooms  without success. Given this information and pressing patients from the waiting room with illness and wait times over 12 hours, we marked the patient as eloped.  Regardless, I sent a message to the [facility] infusion team.   Katie Joyce was evaluated in Emergency Department on 04/22/2020 for the symptoms described in the history of present illness. She was evaluated in the context of the global COVID-19 pandemic, which necessitated consideration that the patient might be at risk for infection with the SARS-CoV-2 virus that causes COVID-19. Institutional protocols and algorithms that pertain to the evaluation of patients at risk for COVID-19 are in a state of rapid change based on information released by regulatory bodies including the CDC and federal and state organizations. These policies and algorithms were followed during the patient's care in the ED.  Vitals:   04/21/20 2136 04/21/20 2158 04/22/20 0145 04/22/20 0605  BP: (!) 146/94 (!) 126/101 (!) 128/97 124/86  Pulse: (!) 107 100 91 92  Resp: 16 18 20 18   Temp: 99 F (37.2 C) 97.7 F (36.5 C) 98.8 F (37.1 C) 98.4 F (36.9 C)  TempSrc: Oral Oral Oral Oral  SpO2:  100% 92% 98% 98%  Weight:      Height:        Final Clinical Impression(s) / ED Diagnoses Final diagnoses:  COVID-19  Nausea, vomiting, and diarrhea    Rx / DC Orders ED Discharge Orders         Ordered    promethazine (PHENERGAN) 25 MG suppository  Every 6 hours PRN        04/22/20 0849           Anselm PancoastJoy, Wray Goehring C, PA-C 04/22/20 0909    Anselm PancoastJoy, Lorcan Shelp C, PA-C 04/22/20 0913    Vanetta MuldersZackowski, Scott, MD 04/22/20 [phone]

## 2020-04-22 NOTE — Discharge Instructions (Signed)
COVID-19 isolation recommendations  Patients who have symptoms consistent with COVID-19 should self isolate until: At least 3 days (72 hours) have passed since recovery, defined as resolution of fever without the use of fever reducing medications and improvement in respiratory symptoms (e.g., cough, shortness of breath), and At least 7 days have passed since symptoms first appeared. Retesting is not required and not recommended as patients can continue to test positive for several weeks despite lack of symptoms.  General Viral Syndrome Care Instructions:  Your symptoms are likely consistent with a viral illness. Viruses do not require or respond to antibiotics. Treatment is symptomatic care and it is important to note that these symptoms may last for 7-14 days.   Hand washing: Wash your hands throughout the day, but especially before and after touching the face, using the restroom, sneezing, coughing, or touching surfaces that have been coughed or sneezed upon. Hydration: Symptoms of most illnesses will be intensified and complicated by dehydration. Dehydration can also extend the duration of symptoms. Drink plenty of fluids and get plenty of rest. You should be drinking at least half a liter of water an hour to stay hydrated. Electrolyte drinks (ex. Gatorade, Powerade, Pedialyte) are also encouraged. You should be drinking enough fluids to make your urine light yellow, almost clear. If this is not the case, you are not drinking enough water. Please note that some of the treatments indicated below will not be effective if you are not adequately hydrated. Diet: Please concentrate on hydration, however, you may introduce food slowly.  Start with a clear liquid diet, progressed to a full liquid diet, and then bland solids as you are able. Pain or fever: Acetaminophen (generic for Tylenol) for pain or fever.  Acetaminophen: May take acetaminophen (generic for Tylenol), as needed, for pain. Your daily  total maximum amount of acetaminophen from all sources should be limited to 4000mg /day for persons without liver problems, or 2000mg /day for those with liver problems. Nausea/vomiting: Use the ondansetron (generic for Zofran) for nausea or vomiting.  This medication may not prevent all vomiting or nausea, but can help facilitate better hydration. Things that can help with nausea/vomiting also include peppermint/menthol candies, vitamin B12, and ginger. Diarrhea: May use medications such as loperamide (Imodium) or Bismuth subsalicylate (Pepto-Bismol). Cough: Teas, warm liquids, broths, and honey can help with cough. Zyrtec or Claritin: May add these medication daily to control underlying symptoms of congestion, sneezing, and other signs of allergies.  These medications are available over-the-counter. Generics: Cetirizine (generic for Zyrtec) and loratadine (generic for Claritin). Fluticasone: Use fluticasone (generic for Flonase), as directed, for nasal and sinus congestion.  This medication is available over-the-counter. Congestion: Plain guaifenesin (generic for plain Mucinex) may help relieve congestion. Saline sinus rinses and saline nasal sprays may also help relieve congestion. If you do not have high blood pressure, heart problems, or an allergy to such medications, you may also try phenylephrine or Sudafed. Sore throat: Warm liquids or Chloraseptic spray may help soothe a sore throat. Gargle twice a day with a salt water solution made from a half teaspoon of salt in a cup of warm water.  Follow up: Follow up with a primary care provider within the next two weeks should symptoms fail to resolve. Return: Return to the ED for significantly worsening symptoms, shortness of breath, persistent vomiting, large amounts of blood in stool, or any other major concerns.  For prescription assistance, may try using prescription discount sites or apps, such as goodrx.com

## 2020-04-22 NOTE — Telephone Encounter (Signed)
I connected by phone with Katie Joyce on 04/22/2020 at 5:10 PM to discuss the potential use of a new treatment for mild to moderate COVID-19 viral infection in non-hospitalized patients.  This patient is a 59 y.o. female that meets the FDA criteria for Emergency Use Authorization of COVID monoclonal antibody casirivimab/imdevimab.  Has a (+) direct SARS-CoV-2 viral test result  Has mild or moderate COVID-19   Is NOT hospitalized due to COVID-19  Is within 10 days of symptom onset  Has at least one of the high risk factor(s) for progression to severe COVID-19 and/or hospitalization as defined in EUA.  Specific high risk criteria : BMI > 25   I have spoken and communicated the following to the patient or parent/caregiver regarding COVID monoclonal antibody treatment:  1. FDA has authorized the emergency use for the treatment of mild to moderate COVID-19 in adults and pediatric patients with positive results of direct SARS-CoV-2 viral testing who are 36 years of age and older weighing at least 40 kg, and who are at high risk for progressing to severe COVID-19 and/or hospitalization.  2. The significant known and potential risks and benefits of COVID monoclonal antibody, and the extent to which such potential risks and benefits are unknown.  3. Information on available alternative treatments and the risks and benefits of those alternatives, including clinical trials.  4. Patients treated with COVID monoclonal antibody should continue to self-isolate and use infection control measures (e.g., wear mask, isolate, social distance, avoid sharing personal items, clean and disinfect high touch surfaces, and frequent handwashing) according to CDC guidelines.   5. The patient or parent/caregiver has the option to accept or refuse COVID monoclonal antibody treatment.  After reviewing this information with the patient, The patient agreed to proceed with receiving casirivimab\imdevimab infusion  and will be provided a copy of the Fact sheet prior to receiving the infusion. Gabriel Cirri 04/22/2020 5:10 PM  Sx onset 8/17

## 2020-04-22 NOTE — Telephone Encounter (Signed)
Called to Discuss with patient about Covid symptoms and the use of regeneron, a monoclonal antibody infusion for those with mild to moderate Covid symptoms and at a high risk of hospitalization.     Pt is qualified for this infusion at the [facility] center due to co-morbid conditions and/or a member of an at-risk group.     Patient eloped from ER so I was unable to discharge to infusion center. I called her by phone but was unable to reach her. Left message to return call. Sent [employer].   Consuello Masse, DNP, AGNP-C [phone] (Infusion Center Hotline)

## 2020-04-23 ENCOUNTER — Ambulatory Visit (HOSPITAL_COMMUNITY)
Admission: RE | Admit: 2020-04-23 | Discharge: 2020-04-23 | Disposition: A | Discharge status: Home / Self Care | Payer: BC Managed Care – PPO | Source: Ambulatory Visit | Attending: Pulmonary Disease | Admitting: Pulmonary Disease

## 2020-04-23 DIAGNOSIS — U071 COVID-19: Secondary | ICD-10-CM | POA: Insufficient documentation

## 2020-04-23 MED ORDER — FAMOTIDINE IN NACL 20-0.9 MG/50ML-% IV SOLN: 20.0000 mg | Freq: Once | INTRAVENOUS | Status: DC | PRN

## 2020-04-23 MED ORDER — SODIUM CHLORIDE 0.9 % IV SOLN: INTRAVENOUS | Status: DC | PRN

## 2020-04-23 MED ORDER — EPINEPHRINE 0.3 MG/0.3ML IJ SOAJ: 0.3000 mg | Freq: Once | INTRAMUSCULAR | Status: DC | PRN

## 2020-04-23 MED ORDER — METHYLPREDNISOLONE SODIUM SUCC 125 MG IJ SOLR: 125.0000 mg | Freq: Once | INTRAMUSCULAR | Status: DC | PRN

## 2020-04-23 MED ORDER — SODIUM CHLORIDE 0.9 % IV SOLN
1200.0000 mg | Freq: Once | INTRAVENOUS | Status: AC
  Administered 2020-04-23: 1200 mg via INTRAVENOUS
  Filled 2020-04-23: qty 10

## 2020-04-23 MED ORDER — DIPHENHYDRAMINE HCL 50 MG/ML IJ SOLN: 50.0000 mg | Freq: Once | INTRAMUSCULAR | Status: DC | PRN

## 2020-04-23 MED ORDER — ALBUTEROL SULFATE HFA 108 (90 BASE) MCG/ACT IN AERS: 2.0000 | INHALATION_SPRAY | Freq: Once | RESPIRATORY_TRACT | Status: DC | PRN

## 2020-04-23 NOTE — Discharge Instructions (Signed)

## 2020-04-23 NOTE — Progress Notes (Signed)
  Diagnosis: COVID-19  Physician:Dr Wright Procedure: [facility] Infusion Clinic Med: casirivimab\imdevimab infusion - Provided patient with casirivimab\imdevimab fact sheet for patients, parents and caregivers prior to infusion.  Complications: No immediate complications noted.  Discharge: Discharged home   Katie Joyce 04/23/2020  

## 2020-06-20 ENCOUNTER — Ambulatory Visit: Payer: BC Managed Care – PPO | Admitting: Family Medicine

## 2020-06-20 ENCOUNTER — Other Ambulatory Visit: Payer: Self-pay | Admitting: *Deleted

## 2020-06-20 DIAGNOSIS — E8940 Asymptomatic postprocedural ovarian failure: Secondary | ICD-10-CM

## 2020-06-20 MED ORDER — ESTROGENS CONJUGATED 0.625 MG PO TABS: 0.6250 mg | ORAL_TABLET | Freq: Every day | ORAL | 5 refills | Status: DC

## 2020-06-25 ENCOUNTER — Ambulatory Visit: Payer: Self-pay | Admitting: Family Medicine

## 2020-07-05 ENCOUNTER — Other Ambulatory Visit: Payer: Self-pay

## 2020-07-05 ENCOUNTER — Ambulatory Visit: Payer: 59 | Admitting: Family Medicine

## 2020-07-05 ENCOUNTER — Encounter: Payer: Self-pay | Admitting: Family Medicine

## 2020-07-05 VITALS — BP 142/97 | HR 86 | Temp 96.4°F | Ht 63.0 in | Wt 210.2 lb

## 2020-07-05 DIAGNOSIS — M25559 Pain in unspecified hip: Secondary | ICD-10-CM | POA: Diagnosis not present

## 2020-07-05 DIAGNOSIS — R109 Unspecified abdominal pain: Secondary | ICD-10-CM

## 2020-07-05 DIAGNOSIS — N3000 Acute cystitis without hematuria: Secondary | ICD-10-CM | POA: Diagnosis not present

## 2020-07-05 DIAGNOSIS — N1832 Chronic kidney disease, stage 3b: Secondary | ICD-10-CM

## 2020-07-05 LAB — URINALYSIS, COMPLETE
Bilirubin, UA: NEGATIVE
Glucose, UA: NEGATIVE
Ketones, UA: NEGATIVE
Nitrite, UA: NEGATIVE
Protein,UA: NEGATIVE
Specific Gravity, UA: >1.03 — ABNORMAL HIGH (ref 1.005–1.030)
Urobilinogen, Ur: 0.2 mg/dL (ref 0.2–1.0)
pH, UA: 5 (ref 5.0–7.5)

## 2020-07-05 LAB — MICROSCOPIC EXAMINATION
Epithelial Cells (non renal): >10 /hpf — ABNORMAL HIGH (ref 0–10)
RBC, Urine: NONE SEEN /hpf (ref 0–2)
WBC, UA: >30 /hpf — ABNORMAL HIGH (ref 0–5)

## 2020-07-05 MED ORDER — DOXYCYCLINE HYCLATE 100 MG PO TABS: 100.0000 mg | ORAL_TABLET | Freq: Two times a day (BID) | ORAL | 0 refills | Status: AC

## 2020-07-05 MED ORDER — METHOCARBAMOL 500 MG PO TABS: 500.0000 mg | ORAL_TABLET | Freq: Three times a day (TID) | ORAL | 2 refills | Status: DC | PRN

## 2020-07-05 NOTE — Progress Notes (Signed)
Subjective: CC: kidney follow up, lower abdominal pain PCP: Loman Brooklyn, FNP  CRF:VOHKGO Katie Joyce is a 59 y.o. female presenting to clinic today for:  1. Kidney follow up Katie Joyce is here to check her kidney function after stopping Mobic. She denies decreased urine or edema.   2. Lower abdominal pain Katie Joyce reports lower abdominal pain for the last month. She reports this has been going on off and on for about 2 years. The pain feels like a pressure. It is worse in the morning. Then usually resolves by mid morning. She has a BM every few days to once a week. Her last BM was yesterday. Her BMs vary from soft to hard. She denies straining. She denies nausea, vomiting, changes in stool color, blood in stool, or diarrhea. She has a history of frequent UTIs, She reports urinary urgency for the last month. She is having to wear poise pad and poise underwear at night. Denies dysuria. She does report urinary odor. She denies fever, chills, or flank pain. She has tried medication for urgency but this caused her to have urinary retention. Denies vaginal discharge or bleeding. She had a hysterectomy and had her bladder tacked in the past.   3. Hip pain Reports that the muscle relaxer has been working well for her hip pain. She is in need of a refill today.   Relevant past medical, surgical, family, and social history reviewed and updated as indicated.  Allergies and medications reviewed and updated.  Allergies  Allergen Reactions  . Aspirin Anaphylaxis and Swelling  . Bee Venom Anaphylaxis  . Ciprofloxacin Shortness Of Breath and Palpitations  . Coconut Oil Anaphylaxis  . Fish Allergy Anaphylaxis  . Niacin And Related Rash    Increased bp  . Oatmeal Anaphylaxis  . Peanut-Containing Drug Products Anaphylaxis  . Penicillins Anaphylaxis  . Shellfish Allergy Anaphylaxis  . Clarithromycin Nausea And Vomiting    rash  . Lac Bovis Rash  . Nitrofurantoin Rash  . Sunscreens Rash  .  Calcium-Containing Compounds     Rash body, mouth ulcers  . Keflex [Cephalexin] Hives  . Eggs Or Egg-Derived Products Diarrhea  . Other Diarrhea    Melon - bloating  . Pork Allergy Diarrhea  . Prunus Persica Diarrhea    Peach  . Sulfa Antibiotics Rash   Past Medical History:  Diagnosis Date  . Anemia   . Bipolar 1 disorder ([place])   . Degenerative disc disease, lumbar   . GERD (gastroesophageal reflux disease)   . Hypercholesteremia   . Low iron     Current Outpatient Medications:  .  albuterol (VENTOLIN HFA) 108 (90 Base) MCG/ACT inhaler, Inhale 2 puffs into the lungs every 4 (four) hours as needed for shortness of breath., Disp: 8 g, Rfl: 2 .  ALPRAZolam (XANAX) 0.25 MG tablet, Take 0.25 mg at bedtime by mouth. , Disp: , Rfl:  .  Cholecalciferol (VITAMIN D3) 5000 units CAPS, Take 1 capsule every evening by mouth. , Disp: , Rfl:  .  cyanocobalamin (,VITAMIN B-12,) 1000 MCG/ML injection, INJECT ONCE A WEEK FOR 4 WEEKS THEN ONCE MONTHLY, Disp: 6 mL, Rfl: 11 .  diphenhydrAMINE (BENADRYL) 25 mg capsule, Take 50 mg at bedtime by mouth., Disp: , Rfl:  .  estrogens, conjugated, (PREMARIN) 0.625 MG tablet, Take 1 tablet (0.625 mg total) by mouth daily., Disp: 30 tablet, Rfl: 5 .  fenofibrate (TRICOR) 145 MG tablet, Take 1 tablet (145 mg total) by mouth daily., Disp: 30 tablet, Rfl: 5 .  ferrous sulfate 325 (65 FE) MG EC tablet, Take 325 mg daily by mouth. , Disp: , Rfl:  .  fexofenadine (ALLEGRA) 180 MG tablet, Take 180 mg every evening by mouth., Disp: , Rfl:  .  Flaxseed, Linseed, (FLAX SEED OIL) 1000 MG CAPS, Take 1 capsule every evening by mouth. , Disp: , Rfl:  .  fluticasone (FLONASE) 50 MCG/ACT nasal spray, Place 2 sprays into both nostrils daily., Disp: 16 g, Rfl: 6 .  lamoTRIgine (LAMICTAL) 100 MG tablet, Take 100 mg every evening by mouth. , Disp: , Rfl:  .  methocarbamol (ROBAXIN) 500 MG tablet, Take 1 tablet (500 mg total) by mouth 3 (three) times daily as needed for muscle  spasms., Disp: 60 tablet, Rfl: 2 .  omeprazole (PRILOSEC) 20 MG capsule, Take 1 capsule (20 mg total) by mouth 2 (two) times daily before a meal., Disp: 60 capsule, Rfl: 5 .  ondansetron (ZOFRAN ODT) 4 MG disintegrating tablet, Take 1 tablet (4 mg total) by mouth every 8 (eight) hours as needed for nausea or vomiting., Disp: 20 tablet, Rfl: 0 .  promethazine (PHENERGAN) 25 MG tablet, Take 1 tablet (25 mg total) by mouth every 8 (eight) hours as needed for nausea or vomiting., Disp: 20 tablet, Rfl: 0 .  SYRINGE-NEEDLE, DISP, 3 ML 25G X 1-1/4" 3 ML MISC, Use for Vitamin B 12 injections., Disp: 800 each, Rfl: 3 .  topiramate (TOPAMAX) 100 MG tablet, Take 100 mg every evening by mouth. , Disp: , Rfl:  .  traZODone (DESYREL) 100 MG tablet, Take 1 tablet (100 mg total) by mouth at bedtime., Disp: 30 tablet, Rfl: 5 Social History   Socioeconomic History  . Marital status: Married    Spouse name: Not on file  . Number of children: Not on file  . Years of education: Not on file  . Highest education level: Not on file  Occupational History  . Occupation: Teacher, music  Tobacco Use  . Smoking status: Former Smoker    Packs/day: 0.50    Years: 40.00    Pack years: 20.00    Types: Cigarettes    Quit date: 07/12/2019    Years since quitting: 0.9  . Smokeless tobacco: Never Used  Vaping Use  . Vaping Use: Every day  Substance and Sexual Activity  . Alcohol use: No  . Drug use: No  . Sexual activity: Yes    Birth control/protection: Surgical  Other Topics Concern  . Not on file  Social History Narrative   Lives with husband and brother   Caffeine use:1 cup coffee per day, soda daily   Right handed   Social Determinants of Health   Financial Resource Strain:   . Difficulty of Paying Living Expenses: Not on file  Food Insecurity:   . Worried About Charity fundraiser in the Last Year: Not on file  . Ran Out of Food in the Last Year: Not on file  Transportation Needs:   . Lack of  Transportation (Medical): Not on file  . Lack of Transportation (Non-Medical): Not on file  Physical Activity:   . Days of Exercise per Week: Not on file  . Minutes of Exercise per Session: Not on file  Stress:   . Feeling of Stress : Not on file  Social Connections:   . Frequency of Communication with Friends and Family: Not on file  . Frequency of Social Gatherings with Friends and Family: Not on file  . Attends Religious Services: Not on file  .  Active Member of Clubs or Organizations: Not on file  . Attends Archivist Meetings: Not on file  . Marital Status: Not on file  Intimate Partner Violence:   . Fear of Current or Ex-Partner: Not on file  . Emotionally Abused: Not on file  . Physically Abused: Not on file  . Sexually Abused: Not on file   Family History  Problem Relation Age of Onset  . Lung cancer Mother   . Pulmonary embolism Father   . Diabetes Brother   . Hypercholesterolemia Brother   . Hypertension Brother   . Colon cancer Neg Hx   . Inflammatory bowel disease Neg Hx   . Celiac disease Neg Hx     Review of Systems  Per HPI.   Objective: Office vital signs reviewed. BP (!) 142/97   Pulse 86   Temp (!) 96.4 F (35.8 C) (Temporal)   Ht '5\' 3"'  (1.6 m)   Wt 210 lb 3.2 oz (95.3 kg)   SpO2 95%   BMI 37.24 kg/m   Physical Examination:  Physical Exam Vitals and nursing note reviewed.  Constitutional:      General: She is not in acute distress.    Appearance: She is not ill-appearing or toxic-appearing.  Cardiovascular:     Rate and Rhythm: Normal rate and regular rhythm.     Heart sounds: Normal heart sounds. No murmur heard.   Pulmonary:     Effort: Pulmonary effort is normal. No respiratory distress.     Breath sounds: Normal breath sounds.  Abdominal:     General: Bowel sounds are normal. There is no distension.     Palpations: Abdomen is soft. There is no shifting dullness or mass.     Tenderness: There is no abdominal tenderness.  There is no right CVA tenderness, left CVA tenderness, guarding or rebound.  Skin:    General: Skin is warm and dry.  Neurological:     General: No focal deficit present.     Mental Status: She is alert and oriented to person, place, and time.  Psychiatric:        Mood and Affect: Mood normal.        Behavior: Behavior normal.     Urine dipstick shows positive for RBC's and positive for leukocytes.  Micro exam: >30 WBC's per HPF, 0 RBC's per HPF and many bacteria.  Results for orders placed or performed during the hospital encounter of 04/21/20  SARS Coronavirus 2 by RT PCR (hospital order, performed in [place] hospital lab) Nasopharyngeal Nasopharyngeal Swab   Specimen: Nasopharyngeal Swab  Result Value Ref Range   SARS Coronavirus 2 POSITIVE (A) NEGATIVE  CBC with Differential  Result Value Ref Range   WBC 6.9 4.0 - 10.5 K/uL   RBC 4.67 3.87 - 5.11 MIL/uL   Hemoglobin 13.4 12.0 - 15.0 g/dL   HCT 42.8 36 - 46 %   MCV 91.6 80.0 - 100.0 fL   MCH 28.7 26.0 - 34.0 pg   MCHC 31.3 30.0 - 36.0 g/dL   RDW 11.9 11.5 - 15.5 %   Platelets 287 150 - 400 K/uL   nRBC 0.0 0.0 - 0.2 %   Neutrophils Relative % 50 %   Neutro Abs 3.5 1.7 - 7.7 K/uL   Lymphocytes Relative 40 %   Lymphs Abs 2.8 0.7 - 4.0 K/uL   Monocytes Relative 7 %   Monocytes Absolute 0.5 0.1 - 1.0 K/uL   Eosinophils Relative 2 %   Eosinophils  Absolute 0.1 0.0 - 0.5 K/uL   Basophils Relative 0 %   Basophils Absolute 0.0 0.0 - 0.1 K/uL   Immature Granulocytes 1 %   Abs Immature Granulocytes 0.06 0.00 - 0.07 K/uL  Basic metabolic panel  Result Value Ref Range   Sodium 139 135 - 145 mmol/Katie   Potassium 4.3 3.5 - 5.1 mmol/Katie   Chloride 105 98 - 111 mmol/Katie   CO2 22 22 - 32 mmol/Katie   Glucose, Bld 100 (H) 70 - 99 mg/dL   BUN 21 (H) 6 - 20 mg/dL   Creatinine, Ser 1.37 (H) 0.44 - 1.00 mg/dL   Calcium 9.9 8.9 - 10.3 mg/dL   GFR calc non Af Amer 43 (Katie) >60 mL/min   GFR calc Af Amer 50 (Katie) >60 mL/min   Anion gap 12 5 - 15    I-Stat Beta hCG blood, ED ([facility], [facility], [facility] only)  Result Value Ref Range   I-stat hCG, quantitative <5.0 <5 mIU/mL   Comment 3             Assessment/ Plan: Katie Joyce was seen today for kidney re check and abdominal pain.  Diagnoses and all orders for this visit:  Stage 3b chronic kidney disease ([place]) Labs pending as below.  -     BMP8+EGFR  Abdominal pain, unspecified abdominal location ? Constipation vs ?bladder prolapse vs UTI. Declined KUB today. Discussed OTC treatment for constipation. Will treat for UTI based on microsopic exam of urine. She will notify me if symptoms do not improve. -     Urinalysis, Complete -     methocarbamol (ROBAXIN) 500 MG tablet; Take 1 tablet (500 mg total) by mouth 3 (three) times daily as needed for muscle spasms. -     Urine Culture -     Microscopic Examination  Acute cystitis without hematuria Culture pending. Start doxycycline. If urgency doesn't improve with treatment of UTI, will refer to urology.  -     doxycycline (VIBRA-TABS) 100 MG tablet; Take 1 tablet (100 mg total) by mouth 2 (two) times daily for 7 days. 1 po bid  Hip pain Refill provided.  -     methocarbamol (ROBAXIN) 500 MG tablet; Take 1 tablet (500 mg total) by mouth 3 (three) times daily as needed for muscle spasms.  Follow as needed. Return to office for new or worsening symptoms, or if symptoms persist.   The above assessment and management plan was discussed with the patient. The patient verbalized understanding of and has agreed to the management plan. Patient is aware to call the clinic if symptoms persist or worsen. Patient is aware when to return to the clinic for a follow-up visit. Patient educated on when it is appropriate to go to the emergency department.   Marjorie Smolder, FNP-C [facility] [address] [place], [place] [address] [phone]

## 2020-07-05 NOTE — Patient Instructions (Signed)
Constipation, Adult Constipation is when a person has fewer bowel movements in a week than normal, has difficulty having a bowel movement, or has stools that are dry, hard, or larger than normal. Constipation may be caused by an underlying condition. It may become worse with age if a person takes certain medicines and does not take in enough fluids. Follow these instructions at home: Eating and drinking   Eat foods that have a lot of fiber, such as fresh fruits and vegetables, whole grains, and beans.  Limit foods that are high in fat, low in fiber, or overly processed, such as french fries, hamburgers, cookies, candies, and soda.  Drink enough fluid to keep your urine clear or pale yellow. General instructions  Exercise regularly or as told by your health care provider.  Go to the restroom when you have the urge to go. Do not hold it in.  Take over-the-counter and prescription medicines only as told by your health care provider. These include any fiber supplements.  Practice pelvic floor retraining exercises, such as deep breathing while relaxing the lower abdomen and pelvic floor relaxation during bowel movements.  Watch your condition for any changes.  Keep all follow-up visits as told by your health care provider. This is important. Contact a health care provider if:  You have pain that gets worse.  You have a fever.  You do not have a bowel movement after 4 days.  You vomit.  You are not hungry.  You lose weight.  You are bleeding from the anus.  You have thin, pencil-like stools. Get help right away if:  You have a fever and your symptoms suddenly get worse.  You leak stool or have blood in your stool.  Your abdomen is bloated.  You have severe pain in your abdomen.  You feel dizzy or you faint. This information is not intended to replace advice given to you by your health care provider. Make sure you discuss any questions you have with your health care  provider. Document Revised: 07/30/2017 Document Reviewed: 02/05/2016 Elsevier Patient Education  2020 [employer] Inc[employer] Acute Urinary Retention, Female  Acute urinary retention means that you cannot pee (urinate) at all, or that you pee too little and your bladder is not emptied completely. If it is not treated, it can lead to kidney damage or other serious problems. Follow these instructions at home:  Take over-the-counter and prescription medicines only as told by your doctor. Ask your doctor what medicines you should stay away from. Do not take any medicine unless your doctor says it is okay to do so.  If you were sent home with a tube that drains pee from the bladder (catheter), take care of it as told by your doctor.  Drink enough fluid to keep your pee clear or pale yellow.  If you were given an antibiotic, take it as told by your doctor. Do not stop taking the antibiotic even if you start to feel better.  Do not use any products that contain nicotine or tobacco, such as cigarettes and e-cigarettes. If you need help quitting, ask your doctor.  Watch for changes in your symptoms. Tell your doctor about them.  If told, keep track of any changes in your blood pressure at home. Tell your doctor about them.  Keep all follow-up visits as told by your doctor. This is important. Contact a doctor if:  You have spasms or you leak pee when you have spasms. Get help right away if:  You  have chills or a fever.  You have blood in your pee.  You have a tube that drains the bladder and: ? The tube stops draining pee. ? The tube falls out. Summary  Acute urinary retention means that you cannot pee at all, or that you pee too little and your bladder is not emptied completely. If it is not treated, it can result in kidney damage or other serious problems.  If you were sent home with a tube that drains pee from the bladder, take care of it as told by your doctor.  Pay attention to any changes  in your symptoms. Tell your doctor about them. This information is not intended to replace advice given to you by your health care provider. Make sure you discuss any questions you have with your health care provider. Document Revised: 07/30/2017 Document Reviewed: 09/18/2016 Elsevier Patient Education  2020 [employer].

## 2020-07-06 LAB — BMP8+EGFR
BUN/Creatinine Ratio: 14 (ref 9–23)
BUN: 18 mg/dL (ref 6–24)
CO2: 23 mmol/L (ref 20–29)
Calcium: 9.6 mg/dL (ref 8.7–10.2)
Chloride: 106 mmol/L (ref 96–106)
Creatinine, Ser: 1.32 mg/dL — ABNORMAL HIGH (ref 0.57–1.00)
GFR calc Af Amer: 52 mL/min/{1.73_m2} — ABNORMAL LOW (ref >59)
GFR calc non Af Amer: 45 mL/min/{1.73_m2} — ABNORMAL LOW (ref >59)
Glucose: 95 mg/dL (ref 65–99)
Potassium: 4.1 mmol/L (ref 3.5–5.2)
Sodium: 143 mmol/L (ref 134–144)

## 2020-07-10 ENCOUNTER — Other Ambulatory Visit: Payer: Self-pay | Admitting: Family Medicine

## 2020-07-10 DIAGNOSIS — N3000 Acute cystitis without hematuria: Secondary | ICD-10-CM

## 2020-07-10 LAB — URINE CULTURE

## 2020-07-10 MED ORDER — FOSFOMYCIN TROMETHAMINE 3 G PO PACK: 3.0000 g | PACK | Freq: Once | ORAL | 0 refills | Status: AC

## 2020-07-10 NOTE — Progress Notes (Signed)
Discontinue Doxycycline for UTI as bacteria is resistant. Fosfomycin 3g packet since in for acute cystitis. Patient has multiple antibiotic allergies.

## 2020-07-15 ENCOUNTER — Telehealth: Payer: Self-pay

## 2020-07-15 NOTE — Telephone Encounter (Signed)
Pt called to let [name] know that she took the Fosfomycin Rx that was sent in for her and is feeling better.

## 2020-07-23 ENCOUNTER — Encounter: Payer: Self-pay | Admitting: Family Medicine

## 2020-07-23 ENCOUNTER — Telehealth: Payer: Self-pay | Admitting: *Deleted

## 2020-07-23 NOTE — Telephone Encounter (Signed)
Prior Auth for PREMARIN 0.625MG  TABLETS- IN PROCESS  KEY [id number]   Your information has been submitted to [employer]. To check for an updated outcome later, reopen this PA request from your dashboard.  If [employer] has not responded to your request within 24 hours, contact [employer] at [phone]. If you think there may be a problem with your PA request, use our live chat feature at the bottom right.

## 2020-07-29 NOTE — Telephone Encounter (Signed)
Coverage for this medication is denied for the following reason(s). We reviewed the information we received about your condition and circumstances. We used plan approved criteria when making this decision. The policy states that this medication may be covered when the requested product cannot be switched to a formulary alternative. Based on the policy and the information we received your request was denied. The request was denied because the requested medication can be switched to the formulary alternative. Formulary alternatives are: estradiol.

## 2020-09-24 ENCOUNTER — Other Ambulatory Visit: Payer: Self-pay | Admitting: Family Medicine

## 2020-09-24 DIAGNOSIS — E782 Mixed hyperlipidemia: Secondary | ICD-10-CM

## 2020-09-24 DIAGNOSIS — F5101 Primary insomnia: Secondary | ICD-10-CM

## 2020-10-09 ENCOUNTER — Encounter: Payer: Self-pay | Admitting: Family Medicine

## 2020-10-27 ENCOUNTER — Other Ambulatory Visit: Payer: Self-pay | Admitting: Family Medicine

## 2020-10-27 DIAGNOSIS — E782 Mixed hyperlipidemia: Secondary | ICD-10-CM

## 2020-10-28 NOTE — Telephone Encounter (Signed)
Katie Joyce NTBS 30 days given 09/25/20

## 2020-11-03 ENCOUNTER — Other Ambulatory Visit: Payer: Self-pay | Admitting: Family Medicine

## 2020-11-03 DIAGNOSIS — E782 Mixed hyperlipidemia: Secondary | ICD-10-CM

## 2020-11-08 ENCOUNTER — Telehealth: Payer: Self-pay

## 2020-11-08 DIAGNOSIS — E8940 Asymptomatic postprocedural ovarian failure: Secondary | ICD-10-CM

## 2020-11-08 DIAGNOSIS — E782 Mixed hyperlipidemia: Secondary | ICD-10-CM

## 2020-11-08 MED ORDER — ESTROGENS CONJUGATED 0.625 MG PO TABS: 0.6250 mg | ORAL_TABLET | Freq: Every day | ORAL | 5 refills | Status: DC

## 2020-11-08 MED ORDER — FENOFIBRATE 145 MG PO TABS: 145.0000 mg | ORAL_TABLET | Freq: Every day | ORAL | 5 refills | Status: AC

## 2020-11-08 NOTE — Telephone Encounter (Signed)
  Prescription Request  11/08/2020  What is the name of the medication or equipment?  fenofibrate (TRICOR) 145 MG tablet estrogens, conjugated, (PREMARIN) 0.625 MG tablet  Have you contacted your pharmacy to request a refill? (if applicable) no ntbs. BJoyce next available apt scheduled for 12/06/2020. Needs refill till then  Which pharmacy would you like this sent to? [facility]   Patient notified that their request is being sent to the clinical staff for review and that they should receive a response within 2 business days.

## 2020-11-08 NOTE — Telephone Encounter (Signed)
meds sent in

## 2020-11-13 ENCOUNTER — Emergency Department (HOSPITAL_COMMUNITY)
Admission: EM | Admit: 2020-11-13 | Discharge: 2020-11-14 | Disposition: A | Discharge status: Home / Self Care | Payer: 59 | Attending: Emergency Medicine | Admitting: Emergency Medicine

## 2020-11-13 ENCOUNTER — Other Ambulatory Visit: Payer: Self-pay

## 2020-11-13 ENCOUNTER — Encounter (HOSPITAL_COMMUNITY): Payer: Self-pay

## 2020-11-13 DIAGNOSIS — N1832 Chronic kidney disease, stage 3b: Secondary | ICD-10-CM | POA: Insufficient documentation

## 2020-11-13 DIAGNOSIS — N390 Urinary tract infection, site not specified: Secondary | ICD-10-CM | POA: Diagnosis not present

## 2020-11-13 DIAGNOSIS — R1031 Right lower quadrant pain: Secondary | ICD-10-CM | POA: Insufficient documentation

## 2020-11-13 DIAGNOSIS — Z87891 Personal history of nicotine dependence: Secondary | ICD-10-CM | POA: Insufficient documentation

## 2020-11-13 DIAGNOSIS — M545 Low back pain, unspecified: Secondary | ICD-10-CM | POA: Diagnosis present

## 2020-11-13 NOTE — ED Triage Notes (Signed)
Pt to er, pt c/o back pain starting yesterday, states that about an hour and a half ago and she felt like she was "trying to urinate a bolder" states that she feels like she is having contractions.  States that the pain is in spasam. Pt denies blood in urine.  Denies vomiting, reports some diarrhea.

## 2020-11-14 ENCOUNTER — Emergency Department (HOSPITAL_COMMUNITY): Payer: 59

## 2020-11-14 LAB — URINALYSIS, ROUTINE W REFLEX MICROSCOPIC
Bilirubin Urine: NEGATIVE
Glucose, UA: NEGATIVE mg/dL
Hgb urine dipstick: NEGATIVE
Ketones, ur: NEGATIVE mg/dL
Nitrite: NEGATIVE
Protein, ur: NEGATIVE mg/dL
Specific Gravity, Urine: 1.004 — ABNORMAL LOW (ref 1.005–1.030)
pH: 6 (ref 5.0–8.0)

## 2020-11-14 LAB — BASIC METABOLIC PANEL
Anion gap: 9 (ref 5–15)
BUN: 13 mg/dL (ref 6–20)
CO2: 22 mmol/L (ref 22–32)
Calcium: 9.1 mg/dL (ref 8.9–10.3)
Chloride: 109 mmol/L (ref 98–111)
Creatinine, Ser: 1.14 mg/dL — ABNORMAL HIGH (ref 0.44–1.00)
GFR, Estimated: 56 mL/min — ABNORMAL LOW (ref >60)
Glucose, Bld: 110 mg/dL — ABNORMAL HIGH (ref 70–99)
Potassium: 3.7 mmol/L (ref 3.5–5.1)
Sodium: 140 mmol/L (ref 135–145)

## 2020-11-14 LAB — CBC WITH DIFFERENTIAL/PLATELET
Abs Immature Granulocytes: 0.11 10*3/uL — ABNORMAL HIGH (ref 0.00–0.07)
Basophils Absolute: 0.1 10*3/uL (ref 0.0–0.1)
Basophils Relative: 1 %
Eosinophils Absolute: 0.2 10*3/uL (ref 0.0–0.5)
Eosinophils Relative: 2 %
HCT: 36.6 % (ref 36.0–46.0)
Hemoglobin: 11.5 g/dL — ABNORMAL LOW (ref 12.0–15.0)
Immature Granulocytes: 1 %
Lymphocytes Relative: 35 %
Lymphs Abs: 3.4 10*3/uL (ref 0.7–4.0)
MCH: 30.3 pg (ref 26.0–34.0)
MCHC: 31.4 g/dL (ref 30.0–36.0)
MCV: 96.3 fL (ref 80.0–100.0)
Monocytes Absolute: 0.6 10*3/uL (ref 0.1–1.0)
Monocytes Relative: 7 %
Neutro Abs: 5.2 10*3/uL (ref 1.7–7.7)
Neutrophils Relative %: 54 %
Platelets: 270 10*3/uL (ref 150–400)
RBC: 3.8 MIL/uL — ABNORMAL LOW (ref 3.87–5.11)
RDW: 12.7 % (ref 11.5–15.5)
WBC: 9.5 10*3/uL (ref 4.0–10.5)
nRBC: 0 % (ref 0.0–0.2)

## 2020-11-14 MED ORDER — PHENAZOPYRIDINE HCL 200 MG PO TABS: 200.0000 mg | ORAL_TABLET | Freq: Three times a day (TID) | ORAL | 0 refills | Status: DC | PRN

## 2020-11-14 MED ORDER — HYDROMORPHONE HCL 1 MG/ML IJ SOLN
1.0000 mg | Freq: Once | INTRAMUSCULAR | Status: AC
Start: 2020-11-14 — End: 2020-11-14
  Administered 2020-11-14: 1 mg via INTRAVENOUS
  Filled 2020-11-14: qty 1

## 2020-11-14 MED ORDER — FOSFOMYCIN TROMETHAMINE 3 G PO PACK
3.0000 g | PACK | Freq: Once | ORAL | Status: AC
  Administered 2020-11-14: 3 g via ORAL
  Filled 2020-11-14: qty 3

## 2020-11-14 MED ORDER — ONDANSETRON HCL 4 MG/2ML IJ SOLN
4.0000 mg | Freq: Once | INTRAMUSCULAR | Status: AC
  Administered 2020-11-14: 4 mg via INTRAVENOUS
  Filled 2020-11-14: qty 2

## 2020-11-14 NOTE — ED Notes (Signed)
Pt to CT at this time.

## 2020-11-14 NOTE — ED Provider Notes (Signed)
[facility] EMERGENCY DEPARTMENT Provider Note   CSN: [id number] Arrival date & time: 11/13/20  2304     History Chief Complaint  Patient presents with  . Back Pain    Katie Joyce is a 60 y.o. female.  Patient presents with complaints of right flank pain.  Patient reports that she had some slight pain in the right lower back area through the course of the day.  Tonight she went to the bathroom, started to urinate and had sudden onset of severe, stabbing pain in the right flank.  Pain is continuous now, feels like contractions.        Past Medical History:  Diagnosis Date  . Anemia   . Bipolar 1 disorder (HCC)   . Degenerative disc disease, lumbar   . GERD (gastroesophageal reflux disease)   . Hypercholesteremia   . Low iron     Patient Active Problem List   Diagnosis Date Noted  . COVID-19 virus infection 04/22/2020  . Stage 3b chronic kidney disease (HCC) 03/20/2020  . Hip pain 09/13/2019  . Sciatic leg pain 09/13/2019  . Primary insomnia 09/13/2019  . Vitamin B12 deficiency 07/13/2017  . Female cystocele 06/21/2017  . Recurrent UTI (urinary tract infection) 06/21/2017  . 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 Bipolar I disorder (HCC) 02/28/2016  . Acid reflux 02/28/2016  . HLD (hyperlipidemia) 02/28/2016  . Obesity (BMI 30-39.9) 02/28/2016    Past Surgical History:  Procedure Laterality Date  . APPENDECTOMY    . BUNIONECTOMY Bilateral   . CATARACT EXTRACTION Right   . CHOLECYSTECTOMY    . COLONOSCOPY WITH PROPOFOL N/A 07/19/2017   Procedure: COLONOSCOPY WITH PROPOFOL;  Surgeon: Corbin Ade, MD;  Location: [facility] SUITE;  Service: Endoscopy;  Laterality: N/A;  10:00am  . ESOPHAGOGASTRODUODENOSCOPY (EGD) WITH PROPOFOL N/A 07/19/2017   Procedure: ESOPHAGOGASTRODUODENOSCOPY (EGD) WITH PROPOFOL;  Surgeon: Corbin Ade, MD;  Location: [facility] SUITE;  Service: Endoscopy;  Laterality: N/A;  . EYE SURGERY Right    cataract extraction  .  TONSILLECTOMY AND ADENOIDECTOMY    . VAGINAL HYSTERECTOMY       OB History   No obstetric history on file.     Family History  Problem Relation Age of Onset  . Lung cancer Mother   . Pulmonary embolism Father   . Diabetes Brother   . Hypercholesterolemia Brother   . Hypertension Brother   . Colon cancer Neg Hx   . Inflammatory bowel disease Neg Hx   . Celiac disease Neg Hx     Social History   Tobacco Use  . Smoking status: Former Smoker    Packs/day: 0.50    Years: 40.00    Pack years: 20.00    Types: Cigarettes    Quit date: 07/12/2019    Years since quitting: 1.3  . Smokeless tobacco: Never Used  Vaping Use  . Vaping Use: Every day  Substance Use Topics  . Alcohol use: No  . Drug use: No    Home Medications Prior to Admission medications   Medication Sig Start Date End Date Taking? Authorizing Provider  phenazopyridine (PYRIDIUM) 200 MG tablet Take 1 tablet (200 mg total) by mouth 3 (three) times daily as needed for pain. 11/14/20  Yes Hendel Gatliff, Canary Brim, MD  albuterol (VENTOLIN HFA) 108 (90 Base) MCG/ACT inhaler Inhale 2 puffs into the lungs every 4 (four) hours as needed for shortness of breath. 09/13/19   Sonny Masters, FNP  ALPRAZolam Prudy Feeler) 0.25 MG tablet Take  0.25 mg at bedtime by mouth.  02/18/16   [provider]  Cholecalciferol (VITAMIN D3) 5000 units CAPS Take 1 capsule every evening by mouth.     [provider]  cyanocobalamin (,VITAMIN B-12,) 1000 MCG/ML injection INJECT ONCE A WEEK FOR 4 WEEKS THEN ONCE MONTHLY 09/13/19   Sonny Mastersakes, Linda M, FNP  diphenhydrAMINE (BENADRYL) 25 mg capsule Take 50 mg at bedtime by mouth.    [provider]  estrogens, conjugated, (PREMARIN) 0.625 MG tablet Take 1 tablet (0.625 mg total) by mouth daily. 11/08/20   Gwenlyn FudgeJoyce, Britney F, FNP  fenofibrate (TRICOR) 145 MG tablet Take 1 tablet (145 mg total) by mouth daily. (Needs to be seen before next refill) 11/08/20   Gwenlyn FudgeJoyce, Britney F, FNP  ferrous  sulfate 325 (65 FE) MG EC tablet Take 325 mg daily by mouth.     [provider]  fexofenadine (ALLEGRA) 180 MG tablet Take 180 mg every evening by mouth.    [provider]  Flaxseed, Linseed, (FLAX SEED OIL) 1000 MG CAPS Take 1 capsule every evening by mouth.     [provider]  fluticasone (FLONASE) 50 MCG/ACT nasal spray Place 2 sprays into both nostrils daily. 03/20/20   Gwenlyn FudgeJoyce, Britney F, FNP  lamoTRIgine (LAMICTAL) 100 MG tablet Take 100 mg every evening by mouth.  12/06/16   [provider]  methocarbamol (ROBAXIN) 500 MG tablet Take 1 tablet (500 mg total) by mouth 3 (three) times daily as needed for muscle spasms. 07/05/20   Gabriel EaringMorgan, [name] M, FNP  omeprazole (PRILOSEC) 20 MG capsule Take 1 capsule (20 mg total) by mouth 2 (two) times daily before a meal. 03/13/20   Gwenlyn FudgeJoyce, Britney F, FNP  ondansetron (ZOFRAN ODT) 4 MG disintegrating tablet Take 1 tablet (4 mg total) by mouth every 8 (eight) hours as needed for nausea or vomiting. 04/19/20   Gwenlyn FudgeJoyce, Britney F, FNP  promethazine (PHENERGAN) 25 MG tablet Take 1 tablet (25 mg total) by mouth every 8 (eight) hours as needed for nausea or vomiting. 04/19/20   McVey, Madelaine BhatElizabeth Whitney, PA-C  SYRINGE-NEEDLE, DISP, 3 ML 25G X 1-1/4" 3 ML MISC Use for Vitamin B 12 injections. 09/13/19   Sonny Mastersakes, Linda M, FNP  topiramate (TOPAMAX) 100 MG tablet Take 100 mg every evening by mouth.     [provider]  traZODone (DESYREL) 100 MG tablet TAKE 1 TABLET BY MOUTH AT BEDTIME 09/25/20   Deliah BostonJoyce, Britney F, FNP    Allergies    Aspirin, Bee venom, Ciprofloxacin, Coconut oil, Fish allergy, Niacin and related, Oatmeal, Peanut-containing drug products, Penicillins, Shellfish allergy, Clarithromycin, Lac bovis, Nitrofurantoin, Sunscreens, Calcium-containing compounds, Keflex [cephalexin], Eggs or egg-derived products, Other, Pork allergy, Prunus persica, and Sulfa antibiotics  Review of Systems   Review of Systems  Genitourinary:  Positive for flank pain.  All other systems reviewed and are negative.   Physical Exam Updated Vital Signs BP (!) 149/89 (BP Location: Left Arm)   Pulse 79   Temp 98 F (36.7 C) (Oral)   Resp 17   Ht 5\' 3"  (1.6 m)   Wt 93.9 kg   SpO2 98%   BMI 36.67 kg/m   Physical Exam Vitals and nursing note reviewed.  Constitutional:      General: She is not in acute distress.    Appearance: Normal appearance. She is well-developed.  HENT:     Head: Normocephalic and atraumatic.     Right Ear: Hearing normal.     Left Ear: Hearing normal.  Nose: Nose normal.  Eyes:     Conjunctiva/sclera: Conjunctivae normal.     Pupils: Pupils are equal, round, and reactive to light.  Cardiovascular:     Rate and Rhythm: Regular rhythm.     Heart sounds: S1 normal and S2 normal. No murmur heard. No friction rub. No gallop.   Pulmonary:     Effort: Pulmonary effort is normal. No respiratory distress.     Breath sounds: Normal breath sounds.  Chest:     Chest wall: No tenderness.  Abdominal:     General: Bowel sounds are normal.     Palpations: Abdomen is soft.     Tenderness: There is no abdominal tenderness. There is right CVA tenderness. There is no guarding or rebound. Negative signs include Murphy's sign and McBurney's sign.     Hernia: No hernia is present.  Musculoskeletal:        General: Normal range of motion.     Cervical back: Normal range of motion and neck supple.  Skin:    General: Skin is warm and dry.     Findings: No rash.  Neurological:     Mental Status: She is alert and oriented to person, place, and time.     GCS: GCS eye subscore is 4. GCS verbal subscore is 5. GCS motor subscore is 6.     Cranial Nerves: No cranial nerve deficit.     Sensory: No sensory deficit.     Coordination: Coordination normal.  Psychiatric:        Speech: Speech normal.        Behavior: Behavior normal.        Thought Content: Thought content normal.     ED Results / Procedures /  Treatments   Labs (all labs ordered are listed, but only abnormal results are displayed) Labs Reviewed  CBC WITH DIFFERENTIAL/PLATELET - Abnormal; Notable for the following components:      Result Value   RBC 3.80 (*)    Hemoglobin 11.5 (*)    Abs Immature Granulocytes 0.11 (*)    All other components within normal limits  BASIC METABOLIC PANEL - Abnormal; Notable for the following components:   Glucose, Bld 110 (*)    Creatinine, Ser 1.14 (*)    GFR, Estimated 56 (*)    All other components within normal limits  URINALYSIS, ROUTINE W REFLEX MICROSCOPIC - Abnormal; Notable for the following components:   Color, Urine STRAW (*)    APPearance HAZY (*)    Specific Gravity, Urine 1.004 (*)    Leukocytes,Ua LARGE (*)    Bacteria, UA RARE (*)    All other components within normal limits  URINE CULTURE    EKG None  Radiology CT RENAL STONE STUDY  Result Date: 11/14/2020 CLINICAL DATA:  Flank and pain EXAM: CT ABDOMEN AND PELVIS WITHOUT CONTRAST TECHNIQUE: Multidetector CT imaging of the abdomen and pelvis was performed following the standard protocol without IV contrast. COMPARISON:  CT 07/07/2017 FINDINGS: Lower chest: Lung bases demonstrate no acute consolidation or effusion. Atelectasis or scarring at the bases. Borderline cardiomegaly. Hepatobiliary: Status post cholecystectomy. No focal hepatic abnormality or biliary dilatation Pancreas: Unremarkable. No pancreatic ductal dilatation or surrounding inflammatory changes. Spleen: Normal in size without focal abnormality. Adrenals/Urinary Tract: Adrenal glands are normal. Exophytic cyst lower pole left kidney. No hydronephrosis. No ureteral stone. Bladder is unremarkable Stomach/Bowel: Stomach is within normal limits. Status post appendectomy. Descending and sigmoid colon diverticular disease without acute inflammatory change. No evidence of bowel wall  thickening, distention, or inflammatory changes. Vascular/Lymphatic: Mild aortic  atherosclerosis. No aneurysm. No suspicious nodes. Reproductive: Status post hysterectomy. No adnexal masses. Other: Negative for free air or free fluid. Musculoskeletal: No acute or significant osseous findings. IMPRESSION: 1. Negative for hydronephrosis or ureteral stone. 2. Descending and sigmoid colon diverticular disease without acute inflammatory change. Aortic Atherosclerosis (ICD10-I70.0). Electronically Signed   By: Jasmine Pang M.D.   On: 11/14/2020 00:48    Procedures Procedures   Medications Ordered in ED Medications  HYDROmorphone (DILAUDID) injection 1 mg (1 mg Intravenous Given 11/14/20 0029)  ondansetron (ZOFRAN) injection 4 mg (4 mg Intravenous Given 11/14/20 0030)  fosfomycin (MONUROL) packet 3 g (3 g Oral Given 11/14/20 0111)    ED Course  I have reviewed the triage vital signs and the nursing notes.  Pertinent labs & imaging results that were available during my care of the patient were reviewed by me and considered in my medical decision making (see chart for details).    MDM Rules/Calculators/A&P                          Patient presents to the emergency department for evaluation of right flank pain that worsens with urination.  Patient mildly tachycardic but afebrile at arrival.  She does have tenderness in the right CVA region.  Kidney stone was suspected but CT scan does not show any evidence of hydronephrosis or ureterolithiasis.  Urinalysis, however, does suggest infection.  She does not appear ill, no concern for systemic illness.  Will culture urine, treat empirically.  Final Clinical Impression(s) / ED Diagnoses Final diagnoses:  Urinary tract infection without hematuria, site unspecified    Rx / DC Orders ED Discharge Orders         Ordered    phenazopyridine (PYRIDIUM) 200 MG tablet  3 times daily PRN        11/14/20 0124           Gilda Crease, MD 11/14/20 [phone]

## 2020-11-16 LAB — URINE CULTURE: Culture: 30000 — AB

## 2020-12-06 ENCOUNTER — Encounter: Payer: Self-pay | Admitting: Family Medicine

## 2020-12-06 ENCOUNTER — Other Ambulatory Visit: Payer: Self-pay

## 2020-12-06 ENCOUNTER — Ambulatory Visit (INDEPENDENT_AMBULATORY_CARE_PROVIDER_SITE_OTHER): Payer: 59 | Admitting: Family Medicine

## 2020-12-06 VITALS — BP 118/74 | HR 93 | Temp 96.6°F | Ht 63.0 in | Wt 209.0 lb

## 2020-12-06 DIAGNOSIS — Z0000 Encounter for general adult medical examination without abnormal findings: Secondary | ICD-10-CM

## 2020-12-06 DIAGNOSIS — E782 Mixed hyperlipidemia: Secondary | ICD-10-CM | POA: Diagnosis not present

## 2020-12-06 DIAGNOSIS — Z0001 Encounter for general adult medical examination with abnormal findings: Secondary | ICD-10-CM

## 2020-12-06 DIAGNOSIS — F319 Bipolar disorder, unspecified: Secondary | ICD-10-CM

## 2020-12-06 DIAGNOSIS — N1832 Chronic kidney disease, stage 3b: Secondary | ICD-10-CM

## 2020-12-06 DIAGNOSIS — F5101 Primary insomnia: Secondary | ICD-10-CM

## 2020-12-06 DIAGNOSIS — M25559 Pain in unspecified hip: Secondary | ICD-10-CM

## 2020-12-06 DIAGNOSIS — E669 Obesity, unspecified: Secondary | ICD-10-CM

## 2020-12-06 DIAGNOSIS — E8940 Asymptomatic postprocedural ovarian failure: Secondary | ICD-10-CM

## 2020-12-06 DIAGNOSIS — K219 Gastro-esophageal reflux disease without esophagitis: Secondary | ICD-10-CM | POA: Diagnosis not present

## 2020-12-06 DIAGNOSIS — E538 Deficiency of other specified B group vitamins: Secondary | ICD-10-CM

## 2020-12-06 MED ORDER — ESTROGENS CONJUGATED 0.3 MG PO TABS: 0.3000 mg | ORAL_TABLET | Freq: Every day | ORAL | 2 refills | Status: AC

## 2020-12-06 MED ORDER — EPINEPHRINE 0.3 MG/0.3ML IJ SOAJ: 0.3000 mg | INTRAMUSCULAR | 5 refills | Status: AC | PRN

## 2020-12-06 MED ORDER — METHOCARBAMOL 500 MG PO TABS: 500.0000 mg | ORAL_TABLET | Freq: Three times a day (TID) | ORAL | 5 refills | Status: AC | PRN

## 2020-12-06 MED ORDER — OMEPRAZOLE 20 MG PO CPDR: 20.0000 mg | DELAYED_RELEASE_CAPSULE | Freq: Two times a day (BID) | ORAL | 1 refills | Status: AC

## 2020-12-06 NOTE — Patient Instructions (Signed)
Decrease Premarin dosage to 0.3 mg once daily x2 weeks, then decrease to every other day x2 weeks, then stop.  Try [employer], [place], or Vitamin E for symptoms should they occur.   Preventive Care 49-58 Years Old, Female Preventive care refers to lifestyle choices and visits with your health care provider that can promote health and wellness. This includes:  A yearly physical exam. This is also called an annual wellness visit.  Regular dental and eye exams.  Immunizations.  Screening for certain conditions.  Healthy lifestyle choices, such as: ? Eating a healthy diet. ? Getting regular exercise. ? Not using drugs or products that contain nicotine and tobacco. ? Limiting alcohol use. What can I expect for my preventive care visit? Physical exam Your health care provider will check your:  Height and weight. These may be used to calculate your BMI (body mass index). BMI is a measurement that tells if you are at a healthy weight.  Heart rate and blood pressure.  Body temperature.  Skin for abnormal spots. Counseling Your health care provider may ask you questions about your:  Past medical problems.  Family's medical history.  Alcohol, tobacco, and drug use.  Emotional well-being.  Home life and relationship well-being.  Sexual activity.  Diet, exercise, and sleep habits.  Work and work Statistician.  Access to firearms.  Method of birth control.  Menstrual cycle.  Pregnancy history. What immunizations do I need? Vaccines are usually given at various ages, according to a schedule. Your health care provider will recommend vaccines for you based on your age, medical history, and lifestyle or other factors, such as travel or where you work.   What tests do I need? Blood tests  Lipid and cholesterol levels. These may be checked every 5 years, or more often if you are over 52 years old.  Hepatitis C test.  Hepatitis B test. Screening  Lung cancer  screening. You may have this screening every year starting at age 78 if you have a 30-pack-year history of smoking and currently smoke or have quit within the past 15 years.  Colorectal cancer screening. ? All adults should have this screening starting at age 8 and continuing until age 10. ? Your health care provider may recommend screening at age 58 if you are at increased risk. ? You will have tests every 1-10 years, depending on your results and the type of screening test.  Diabetes screening. ? This is done by checking your blood sugar (glucose) after you have not eaten for a while (fasting). ? You may have this done every 1-3 years.  Mammogram. ? This may be done every 1-2 years. ? Talk with your health care provider about when you should start having regular mammograms. This may depend on whether you have a family history of breast cancer.  BRCA-related cancer screening. This may be done if you have a family history of breast, ovarian, tubal, or peritoneal cancers.  Pelvic exam and Pap test. ? This may be done every 3 years starting at age 68. ? Starting at age 43, this may be done every 5 years if you have a Pap test in combination with an HPV test. Other tests  STD (sexually transmitted disease) testing, if you are at risk.  Bone density scan. This is done to screen for osteoporosis. You may have this scan if you are at high risk for osteoporosis. Talk with your health care provider about your test results, treatment options, and if necessary, the need  for more tests. Follow these instructions at home: Eating and drinking  Eat a diet that includes fresh fruits and vegetables, whole grains, lean protein, and low-fat dairy products.  Take vitamin and mineral supplements as recommended by your health care provider.  Do not drink alcohol if: ? Your health care provider tells you not to drink. ? You are pregnant, may be pregnant, or are planning to become pregnant.  If you  drink alcohol: ? Limit how much you have to 0-1 drink a day. ? Be aware of how much alcohol is in your drink. In the [place] one drink equals one 12 oz bottle of beer (355 mL), one 5 oz glass of wine (148 mL), or one 1 oz glass of hard liquor (44 mL).   Lifestyle  Take daily care of your teeth and gums. Brush your teeth every morning and night with fluoride toothpaste. Floss one time each day.  Stay active. Exercise for at least 30 minutes 5 or more days each week.  Do not use any products that contain nicotine or tobacco, such as cigarettes, e-cigarettes, and chewing tobacco. If you need help quitting, ask your health care provider.  Do not use drugs.  If you are sexually active, practice safe sex. Use a condom or other form of protection to prevent STIs (sexually transmitted infections).  If you do not wish to become pregnant, use a form of birth control. If you plan to become pregnant, see your health care provider for a prepregnancy visit.  If told by your health care provider, take low-dose aspirin daily starting at age 25.  Find healthy ways to cope with stress, such as: ? Meditation, yoga, or listening to music. ? Journaling. ? Talking to a trusted person. ? Spending time with friends and family. Safety  Always wear your seat belt while driving or riding in a vehicle.  Do not drive: ? If you have been drinking alcohol. Do not ride with someone who has been drinking. ? When you are tired or distracted. ? While texting.  Wear a helmet and other protective equipment during sports activities.  If you have firearms in your house, make sure you follow all gun safety procedures. What's next?  Visit your health care provider once a year for an annual wellness visit.  Ask your health care provider how often you should have your eyes and teeth checked.  Stay up to date on all vaccines. This information is not intended to replace advice given to you by your health care provider.  Make sure you discuss any questions you have with your health care provider. Document Revised: 05/21/2020 Document Reviewed: 04/28/2018 Elsevier Patient Education  2021 [employer].

## 2020-12-06 NOTE — Progress Notes (Signed)
Assessment & Plan:  1. Well adult exam Preventive health education provided.  Patient is interested in a COVID-19 booster vaccine and will schedule this at her pharmacy.  She declines tetanus and shingles vaccines.  She will schedule her mammogram on the bus that comes to our office. - CBC with Differential/Platelet - CMP14+EGFR - Lipid panel - TSH  2. Mixed hyperlipidemia Previously uncontrolled.  Patient was advised to work on lifestyle modifications.  She reports she is able to tolerate statins. - CMP14+EGFR - Lipid panel  3. Stage 3b chronic kidney disease (Katie Joyce) Labs to assess. - CMP14+EGFR  4. Gastroesophageal reflux disease without esophagitis Well controlled on current regimen.  - omeprazole (PRILOSEC) 20 MG capsule; Take 1 capsule (20 mg total) by mouth 2 (two) times daily before a meal.  Dispense: 180 capsule; Refill: 1  5. Postsurgical ovarian failure Is unable to afford Premarin 0.14m $250 per month.  We are going to try to wean off of this since she has been on it for a very long time.  I have decreased her dosage to 0.3 mg.  She is going to take this once daily x2-week and then decrease to every other day x2 weeks.  Advised if she starts having symptoms of night sweats or her mood worsens to try Estroven, black cohosh, or vitamin D OTC. - estrogens, conjugated, (PREMARIN) 0.3 MG tablet; Take 1 tablet (0.3 mg total) by mouth daily.  Dispense: 30 tablet; Refill: 2  6. Hip pain Well controlled on current regimen.  Handicap placard given to patient today as she reports she is unable to walk from the car into the parking lot into the store without increased pain. - methocarbamol (ROBAXIN) 500 MG tablet; Take 1 tablet (500 mg total) by mouth 3 (three) times daily as needed for muscle spasms.  Dispense: 90 tablet; Refill: 5  7. Vitamin B12 deficiency Labs to assess. - Vitamin B12  8. Obesity (BMI 30-39.9) Diet and exercise encouraged.  9. Primary insomnia Managed by  psychiatry. - zolpidem (AMBIEN CR) 6.25 MG CR tablet; Take 6.25 mg by mouth at bedtime.  10. Bipolar I disorder ([place] Managed by psychiatry.   Follow-up: Return in about 4 weeks (around 01/03/2021) for Premarin.   BHendricks Limes MSN, APRN, FNP-C [facility]  Subjective:  Patient ID: Katie Joyce female    DOB: [birth year] Age: 60y.o. MRN: [id number] Patient Care Team: JLoman Brooklyn FNP as PCP - General (Family Medicine) RGala RomneyRCristopher Estimable MD as Consulting Physician (Gastroenterology)   CC:  Chief Complaint  Patient presents with  . Annual Exam    HPI [name] for her annual physical.  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, Marital status: Married, Substance use: None Diet: regular, Exercise: none Last eye exam: >1 year ago Last dental exam: does not go Last colonoscopy: 2018 with repeat in 10 years Last mammogram: January 2021 Last pap smear: Hysterectomy Hepatitis C Screening: 2017 Immunizations: Flu Vaccine: declined Tdap Vaccine: declined  Shingrix Vaccine: declined  COVID-19 Vaccine: received 1st two doses; interested in booster  DEPRESSION SCREENING PHQ 2/9 Scores 12/06/2020 07/05/2020 03/20/2020 03/13/2020 09/13/2019 12/20/2017 11/19/2017  PHQ - 2 Score 0 0 0 0 0 0 0     Review of Systems  Constitutional: Negative for chills, fever, malaise/fatigue and weight loss.  HENT: Negative for congestion, ear discharge, ear pain, nosebleeds, sinus pain, sore throat and tinnitus.  Eyes: Negative for blurred vision, double vision, pain, discharge and redness.  Respiratory: Negative for cough, shortness of breath and wheezing.   Cardiovascular: Negative for chest pain, palpitations and leg swelling.  Gastrointestinal: Negative for abdominal pain, constipation, diarrhea, heartburn, nausea and vomiting.  Genitourinary: Negative for dysuria, frequency and urgency.   Musculoskeletal: Positive for joint pain. Negative for myalgias.  Skin: Negative for rash.  Neurological: Negative for dizziness, seizures, weakness and headaches.  Psychiatric/Behavioral: Negative for depression, substance abuse and suicidal ideas. The patient is not nervous/anxious.      Current Outpatient Medications:  .  ALPRAZolam (XANAX) 0.25 MG tablet, Take 0.25 mg at bedtime by mouth. , Disp: , Rfl:  .  Cholecalciferol (VITAMIN D3) 5000 units CAPS, Take 2,000 capsules by mouth every evening., Disp: , Rfl:  .  cyanocobalamin (,VITAMIN B-12,) 1000 MCG/ML injection, INJECT ONCE A WEEK FOR 4 WEEKS THEN ONCE MONTHLY, Disp: 6 mL, Rfl: 11 .  diphenhydrAMINE (BENADRYL) 25 mg capsule, Take 50 mg at bedtime by mouth., Disp: , Rfl:  .  EPINEPHrine 0.3 mg/0.3 mL IJ SOAJ injection, Inject 0.3 mg into the muscle as needed for anaphylaxis., Disp: , Rfl:  .  estrogens, conjugated, (PREMARIN) 0.625 MG tablet, Take 1 tablet (0.625 mg total) by mouth daily., Disp: 30 tablet, Rfl: 5 .  fenofibrate (TRICOR) 145 MG tablet, Take 1 tablet (145 mg total) by mouth daily. (Needs to be seen before next refill), Disp: 30 tablet, Rfl: 5 .  ferrous sulfate 325 (65 FE) MG EC tablet, Take 325 mg daily by mouth. , Disp: , Rfl:  .  fexofenadine (ALLEGRA) 180 MG tablet, Take 180 mg every evening by mouth., Disp: , Rfl:  .  Flaxseed, Linseed, (FLAX SEED OIL) 1000 MG CAPS, Take 1 capsule every evening by mouth. , Disp: , Rfl:  .  lamoTRIgine (LAMICTAL) 100 MG tablet, Take 100 mg every evening by mouth. , Disp: , Rfl:  .  methocarbamol (ROBAXIN) 500 MG tablet, Take 1 tablet (500 mg total) by mouth 3 (three) times daily as needed for muscle spasms., Disp: 60 tablet, Rfl: 2 .  omeprazole (PRILOSEC) 20 MG capsule, Take 1 capsule (20 mg total) by mouth 2 (two) times daily before a meal., Disp: 60 capsule, Rfl: 5 .  topiramate (TOPAMAX) 100 MG tablet, Take 100 mg every evening by mouth. , Disp: , Rfl:  .  zolpidem (AMBIEN CR)  6.25 MG CR tablet, Take 6.25 mg by mouth at bedtime., Disp: , Rfl:   Allergies  Allergen Reactions  . Aspirin Anaphylaxis and Swelling  . Bee Venom Anaphylaxis  . Ciprofloxacin Shortness Of Breath and Palpitations  . Coconut Oil Anaphylaxis  . Fish Allergy Anaphylaxis  . Niacin And Related Rash    Increased bp  . Oatmeal Anaphylaxis  . Peanut-Containing Drug Products Anaphylaxis  . Penicillins Anaphylaxis  . Shellfish Allergy Anaphylaxis  . Clarithromycin Nausea And Vomiting    rash  . Lac Bovis Rash  . Nitrofurantoin Rash  . Sunscreens Rash  . Calcium-Containing Compounds     Rash body, mouth ulcers  . Keflex [Cephalexin] Hives  . Eggs Or Egg-Derived Products Diarrhea  . Other Diarrhea    Melon - bloating  . Pork Allergy Diarrhea  . Prunus Persica Diarrhea    Peach  . Sulfa Antibiotics Rash    Past Medical History:  Diagnosis Date  . Anemia   . Bipolar 1 disorder ([place])   . Degenerative disc disease, lumbar   . GERD (gastroesophageal reflux  disease)   . Hypercholesteremia   . Low iron     Past Surgical History:  Procedure Laterality Date  . APPENDECTOMY    . BUNIONECTOMY Bilateral   . CATARACT EXTRACTION Right   . CHOLECYSTECTOMY    . COLONOSCOPY WITH PROPOFOL N/A 07/19/2017   Procedure: COLONOSCOPY WITH PROPOFOL;  Surgeon: Daneil Dolin, MD;  [facility]: [facility];  Service: Endoscopy;  Laterality: N/A;  10:00am  . ESOPHAGOGASTRODUODENOSCOPY (EGD) WITH PROPOFOL N/A 07/19/2017   Procedure: ESOPHAGOGASTRODUODENOSCOPY (EGD) WITH PROPOFOL;  Surgeon: Daneil Dolin, MD;  Location: [facility];  Service: Endoscopy;  Laterality: N/A;  . EYE SURGERY Right    cataract extraction  . TONSILLECTOMY AND ADENOIDECTOMY    . VAGINAL HYSTERECTOMY      Family History  Problem Relation Age of Onset  . Lung cancer Mother   . Pulmonary embolism Father   . Diabetes Brother   . Hypercholesterolemia Brother   . Hypertension Brother   . Colon cancer Neg Hx   .  Inflammatory bowel disease Neg Hx   . Celiac disease Neg Hx     Social History   Socioeconomic History  . Marital status: Married    Spouse name: Not on file  . Number of children: Not on file  . Years of education: Not on file  . Highest education level: Not on file  Occupational History  . Occupation: Teacher, music  Tobacco Use  . Smoking status: Former Smoker    Packs/day: 0.50    Years: 40.00    Pack years: 20.00    Types: Cigarettes    Quit date: 07/12/2019    Years since quitting: 1.4  . Smokeless tobacco: Never Used  Vaping Use  . Vaping Use: Every day  Substance and Sexual Activity  . Alcohol use: No  . Drug use: No  . Sexual activity: Yes    Birth control/protection: Surgical  Other Topics Concern  . Not on file  Social History Narrative   Lives with husband and brother   Caffeine use:1 cup coffee per day, soda daily   Right handed   Social Determinants of Health   Financial Resource Strain: Not on file  Food Insecurity: Not on file  Transportation Needs: Not on file  Physical Activity: Not on file  Stress: Not on file  Social Connections: Not on file  Intimate Partner Violence: Not on file      Objective:    BP (!) 144/98   Pulse 93   Temp (!) 96.6 F (35.9 C) (Temporal)   Ht _0  (1.6 m)   Wt 209 lb (94.8 kg)   SpO2 94%   BMI 37.02 kg/m   Wt Readings from Last 3 Encounters:  12/06/20 209 lb (94.8 kg)  11/13/20 207 lb (93.9 kg)  07/05/20 210 lb 3.2 oz (95.3 kg)    Physical Exam Vitals reviewed.  Constitutional:      General: She is not in acute distress.    Appearance: Normal appearance. She is morbidly obese. She is not ill-appearing, toxic-appearing or diaphoretic.  HENT:     Head: Normocephalic and atraumatic.     Right Ear: Tympanic membrane, ear canal and external ear normal. There is no impacted cerumen.     Left Ear: Tympanic membrane, ear canal and external ear normal. There is no impacted cerumen.     Nose: Nose normal.  No congestion or rhinorrhea.     Mouth/Throat:     Mouth: Mucous membranes are moist.  Pharynx: Oropharynx is clear. No oropharyngeal exudate or posterior oropharyngeal erythema.  Eyes:     General: No scleral icterus.       Right eye: No discharge.        Left eye: No discharge.     Conjunctiva/sclera: Conjunctivae normal.     Pupils: Pupils are equal, round, and reactive to light.  Cardiovascular:     Rate and Rhythm: Normal rate and regular rhythm.     Heart sounds: Normal heart sounds. No murmur heard. No friction rub. No gallop.   Pulmonary:     Effort: Pulmonary effort is normal. No respiratory distress.     Breath sounds: Normal breath sounds. No stridor. No wheezing, rhonchi or rales.  Abdominal:     General: Abdomen is flat. Bowel sounds are normal. There is no distension.     Palpations: Abdomen is soft. There is no mass.     Tenderness: There is no abdominal tenderness. There is no guarding or rebound.     Hernia: No hernia is present.  Musculoskeletal:        General: Normal range of motion.     Cervical back: Normal range of motion and neck supple. No rigidity. No muscular tenderness.  Lymphadenopathy:     Cervical: No cervical adenopathy.  Skin:    General: Skin is warm and dry.     Capillary Refill: Capillary refill takes less than 2 seconds.  Neurological:     General: No focal deficit present.     Mental Status: She is alert and oriented to person, place, and time. Mental status is at baseline.  Psychiatric:        Mood and Affect: Mood normal.        Behavior: Behavior normal.        Thought Content: Thought content normal.        Judgment: Judgment normal.     Lab Results  Component Value Date   TSH 1.560 09/14/2019   Lab Results  Component Value Date   WBC 9.5 11/13/2020   HGB 11.5 (L) 11/13/2020   HCT 36.6 11/13/2020   MCV 96.3 11/13/2020   PLT 270 11/13/2020   Lab Results  Component Value Date   NA 140 11/13/2020   K 3.7 11/13/2020    CO2 22 11/13/2020   GLUCOSE 110 (H) 11/13/2020   BUN 13 11/13/2020   CREATININE 1.14 (H) 11/13/2020   BILITOT 0.2 03/13/2020   ALKPHOS 46 (L) 03/13/2020   AST 16 03/13/2020   ALT 19 03/13/2020   PROT 6.9 03/13/2020   ALBUMIN 4.4 03/13/2020   CALCIUM 9.1 11/13/2020   ANIONGAP 9 11/13/2020   Lab Results  Component Value Date   CHOL 214 (H) 03/13/2020   Lab Results  Component Value Date   HDL 39 (L) 03/13/2020   Lab Results  Component Value Date   LDLCALC 119 (H) 03/13/2020   Lab Results  Component Value Date   TRIG 320 (H) 03/13/2020   Lab Results  Component Value Date   CHOLHDL 5.5 (H) 03/13/2020   Lab Results  Component Value Date   HGBA1C 5.6 02/28/2016

## 2020-12-07 LAB — CMP14+EGFR
ALT: 23 IU/L (ref 0–32)
AST: 23 IU/L (ref 0–40)
Albumin/Globulin Ratio: 1.9 (ref 1.2–2.2)
Albumin: 4.6 g/dL (ref 3.8–4.9)
Alkaline Phosphatase: 47 IU/L (ref 44–121)
BUN/Creatinine Ratio: 14 (ref 9–23)
BUN: 17 mg/dL (ref 6–24)
Bilirubin Total: 0.3 mg/dL (ref 0.0–1.2)
CO2: 20 mmol/L (ref 20–29)
Calcium: 10.1 mg/dL (ref 8.7–10.2)
Chloride: 106 mmol/L (ref 96–106)
Creatinine, Ser: 1.24 mg/dL — ABNORMAL HIGH (ref 0.57–1.00)
Globulin, Total: 2.4 g/dL (ref 1.5–4.5)
Glucose: 105 mg/dL — ABNORMAL HIGH (ref 65–99)
Potassium: 5 mmol/L (ref 3.5–5.2)
Sodium: 142 mmol/L (ref 134–144)
Total Protein: 7 g/dL (ref 6.0–8.5)
eGFR: 51 mL/min/{1.73_m2} — ABNORMAL LOW (ref >59)

## 2020-12-07 LAB — CBC WITH DIFFERENTIAL/PLATELET
Basophils Absolute: 0.1 10*3/uL (ref 0.0–0.2)
Basos: 1 %
EOS (ABSOLUTE): 0.1 10*3/uL (ref 0.0–0.4)
Eos: 2 %
Hematocrit: 40 % (ref 34.0–46.6)
Hemoglobin: 13.2 g/dL (ref 11.1–15.9)
Immature Grans (Abs): 0.1 10*3/uL (ref 0.0–0.1)
Immature Granulocytes: 1 %
Lymphocytes Absolute: 2.3 10*3/uL (ref 0.7–3.1)
Lymphs: 26 %
MCH: 29.5 pg (ref 26.6–33.0)
MCHC: 33 g/dL (ref 31.5–35.7)
MCV: 90 fL (ref 79–97)
Monocytes Absolute: 0.7 10*3/uL (ref 0.1–0.9)
Monocytes: 7 %
Neutrophils Absolute: 5.8 10*3/uL (ref 1.4–7.0)
Neutrophils: 63 %
Platelets: 292 10*3/uL (ref 150–450)
RBC: 4.47 x10E6/uL (ref 3.77–5.28)
RDW: 11.8 % (ref 11.7–15.4)
WBC: 9 10*3/uL (ref 3.4–10.8)

## 2020-12-07 LAB — VITAMIN B12: Vitamin B-12: 585 pg/mL (ref 232–1245)

## 2020-12-07 LAB — LIPID PANEL
Chol/HDL Ratio: 5.8 ratio — ABNORMAL HIGH (ref 0.0–4.4)
Cholesterol, Total: 250 mg/dL — ABNORMAL HIGH (ref 100–199)
HDL: 43 mg/dL (ref >39)
LDL Chol Calc (NIH): 153 mg/dL — ABNORMAL HIGH (ref 0–99)
Triglycerides: 294 mg/dL — ABNORMAL HIGH (ref 0–149)
VLDL Cholesterol Cal: 54 mg/dL — ABNORMAL HIGH (ref 5–40)

## 2020-12-07 LAB — TSH: TSH: 0.632 u[IU]/mL (ref 0.450–4.500)

## 2020-12-12 ENCOUNTER — Encounter: Payer: Self-pay | Admitting: Family Medicine

## 2020-12-12 ENCOUNTER — Ambulatory Visit (INDEPENDENT_AMBULATORY_CARE_PROVIDER_SITE_OTHER): Payer: 59 | Admitting: Family Medicine

## 2020-12-12 DIAGNOSIS — H1033 Unspecified acute conjunctivitis, bilateral: Secondary | ICD-10-CM | POA: Diagnosis not present

## 2020-12-12 MED ORDER — AZITHROMYCIN 1 % OP SOLN: OPHTHALMIC | 0 refills | Status: DC

## 2020-12-12 NOTE — Progress Notes (Signed)
   Virtual Visit  Note Due to COVID-19 pandemic this visit was conducted virtually. This visit type was conducted due to national recommendations for restrictions regarding the COVID-19 Pandemic (e.g. social distancing, sheltering in place) in an effort to limit this patient's exposure and mitigate transmission in our community. All issues noted in this document were discussed and addressed.  A physical exam was not performed with this format.  I connected with Katie Joyce on 12/12/20 at 1251 by telephone and verified that I am speaking with the correct person using two identifiers. Katie Joyce is currently located at home and no one is currently with her during the visit. The provider, Gabriel Earing, FNP is located in their office at time of visit.  I discussed the limitations, risks, security and privacy concerns of performing an evaluation and management service by telephone and the availability of in person appointments. I also discussed with the patient that there may be a patient responsible charge related to this service. The patient expressed understanding and agreed to proceed.  CC: pink eye  History and Present Illness:  HPI  Katie Joyce reports drainage from her right eye x2 days. The drainage is yellow. She woke up with her eye matted. Her eye is also red. It is a little swollen under her eye and tender. She has tried some eye drops for allergies without improvement. Her left eye is now starting to look pink. Denies fever.     ROS As per HPI.   Observations/Objective: Alert and oriented x 3. Able to speak in full sentences without difficulty.    Assessment and Plan: Katie Joyce was seen today for conjunctivitis.  Diagnoses and all orders for this visit:  Acute bacterial conjunctivitis of both eyes Azithromycin drops as below. Patient has multiple allergies to various antibiotics but has tolerated azithromycin in the past. Wash hands after touching eye to avoid spreading  to others. Do not share towels etc with others. Return to office for new or worsening symptoms, or if symptoms persist.  -     azithromycin (AZASITE) 1 % ophthalmic solution; Place one drop in each eye twice daily for 2 days, then once daily for 5 days.     Follow Up Instructions: As needed.     I discussed the assessment and treatment plan with the patient. The patient was provided an opportunity to ask questions and all were answered. The patient agreed with the plan and demonstrated an understanding of the instructions.   The patient was advised to call back or seek an in-person evaluation if the symptoms worsen or if the condition fails to improve as anticipated.  The above assessment and management plan was discussed with the patient. The patient verbalized understanding of and has agreed to the management plan. Patient is aware to call the clinic if symptoms persist or worsen. Patient is aware when to return to the clinic for a follow-up visit. Patient educated on when it is appropriate to go to the emergency department.   Time call ended:  1300  I provided 9 minutes of  non face-to-face time during this encounter.    Gabriel Earing, FNP

## 2020-12-13 ENCOUNTER — Encounter: Payer: Self-pay | Admitting: Family Medicine

## 2020-12-13 ENCOUNTER — Other Ambulatory Visit: Payer: Self-pay | Admitting: Nurse Practitioner

## 2020-12-13 MED ORDER — BACITRACIN-POLYMYXIN B 500-10000 UNIT/GM OP OINT: 1.0000 "application " | TOPICAL_OINTMENT | Freq: Two times a day (BID) | OPHTHALMIC | 1 refills | Status: DC

## 2020-12-13 NOTE — Progress Notes (Signed)
Patient unable to pick up azithromycin eye drops due to insurance non coverage and medication not available even if patient wants to pay out of pocket until Monday. switched medication to bacitracin. RX sent to [facility] pharmacy

## 2020-12-16 ENCOUNTER — Other Ambulatory Visit: Payer: Self-pay | Admitting: Family Medicine

## 2020-12-19 ENCOUNTER — Other Ambulatory Visit: Payer: Self-pay

## 2020-12-19 ENCOUNTER — Encounter: Payer: Self-pay | Admitting: Family Medicine

## 2020-12-19 ENCOUNTER — Ambulatory Visit: Payer: 59 | Admitting: Family Medicine

## 2020-12-19 VITALS — BP 125/86 | HR 95 | Ht 63.0 in | Wt 209.0 lb

## 2020-12-19 DIAGNOSIS — J302 Other seasonal allergic rhinitis: Secondary | ICD-10-CM | POA: Diagnosis not present

## 2020-12-19 MED ORDER — OLOPATADINE HCL 0.2 % OP SOLN: 1.0000 [drp] | Freq: Every day | OPHTHALMIC | 2 refills | Status: AC

## 2020-12-19 MED ORDER — LEVOCETIRIZINE DIHYDROCHLORIDE 5 MG PO TABS: 5.0000 mg | ORAL_TABLET | Freq: Every evening | ORAL | 2 refills | Status: AC

## 2020-12-19 MED ORDER — FLUTICASONE PROPIONATE 50 MCG/ACT NA SUSP: 2.0000 | Freq: Every day | NASAL | 2 refills | Status: DC

## 2020-12-19 NOTE — Progress Notes (Signed)
Assessment & Plan:  1. Seasonal allergies Uncontrolled. Changed Allegra to Xyzal. Rx'd Flonase and Pataday.  Education provided on allergic conjunctivitis. - levocetirizine (XYZAL) 5 MG tablet; Take 1 tablet (5 mg total) by mouth every evening.  Dispense: 30 tablet; Refill: 2 - Olopatadine HCl (PATADAY) 0.2 % SOLN; Apply 1 drop to eye daily.  Dispense: 2.5 mL; Refill: 2   Follow up plan: Return if symptoms worsen or fail to improve.  Katie Boston, MSN, APRN, FNP-C [facility]  Subjective:   Patient ID: Katie Joyce, female    DOB: 1960-12-29, 60 y.o.   MRN: [id number]  HPI: Katie Joyce is a 60 y.o. female presenting on 12/19/2020 for eye irritation (Bilateral. Started last Wed. Itching and red eye. Ointment has helped some but not clearing.//Has eye drainage) and Ear Fullness (Left ears feels stopped up)  Patient reports her eyes have been red with watery drainage.  They are also itchy and tender underneath from rubbing them.  Additionally she has had a runny nose, ear fullness, and tender cervical lymph nodes on the left side.  She has been taking Allegra once daily for a very long time.  She does not use any nasal sprays.  She was prescribed Polysporin ointment during a telephone visit which she reports has not helped her eyes.   ROS: Negative unless specifically indicated above in HPI.   Relevant past medical history reviewed and updated as indicated.   Allergies and medications reviewed and updated.   Current Outpatient Medications:  .  ALPRAZolam (XANAX) 0.25 MG tablet, Take 0.25 mg at bedtime by mouth. , Disp: , Rfl:  .  bacitracin-polymyxin b (POLYSPORIN) ophthalmic ointment, Place 1 application into both eyes every 12 (twelve) hours. apply to eye every 12 hours while awake, Disp: 3.5 g, Rfl: 1 .  Cholecalciferol (VITAMIN D3) 5000 units CAPS, Take 2,000 capsules by mouth every evening., Disp: , Rfl:  .  cyanocobalamin (,VITAMIN B-12,) 1000  MCG/ML injection, INJECT ONCE A WEEK FOR 4 WEEKS THEN ONCE MONTHLY, Disp: 6 mL, Rfl: 11 .  diphenhydrAMINE (BENADRYL) 25 mg capsule, Take 50 mg at bedtime by mouth., Disp: , Rfl:  .  EPINEPHrine 0.3 mg/0.3 mL IJ SOAJ injection, Inject 0.3 mg into the muscle as needed for anaphylaxis., Disp: 1 each, Rfl: 5 .  estrogens, conjugated, (PREMARIN) 0.3 MG tablet, Take 1 tablet (0.3 mg total) by mouth daily., Disp: 30 tablet, Rfl: 2 .  fenofibrate (TRICOR) 145 MG tablet, Take 1 tablet (145 mg total) by mouth daily. (Needs to be seen before next refill), Disp: 30 tablet, Rfl: 5 .  ferrous sulfate 325 (65 FE) MG EC tablet, Take 325 mg daily by mouth. , Disp: , Rfl:  .  fexofenadine (ALLEGRA) 180 MG tablet, Take 180 mg every evening by mouth., Disp: , Rfl:  .  Flaxseed, Linseed, (FLAX SEED OIL) 1000 MG CAPS, Take 1 capsule every evening by mouth. , Disp: , Rfl:  .  lamoTRIgine (LAMICTAL) 100 MG tablet, Take 100 mg every evening by mouth. , Disp: , Rfl:  .  methocarbamol (ROBAXIN) 500 MG tablet, Take 1 tablet (500 mg total) by mouth 3 (three) times daily as needed for muscle spasms., Disp: 90 tablet, Rfl: 5 .  omeprazole (PRILOSEC) 20 MG capsule, Take 1 capsule (20 mg total) by mouth 2 (two) times daily before a meal., Disp: 180 capsule, Rfl: 1 .  topiramate (TOPAMAX) 100 MG tablet, Take 100 mg every evening by mouth. , Disp: ,  Rfl:  .  zolpidem (AMBIEN CR) 6.25 MG CR tablet, Take 6.25 mg by mouth at bedtime., Disp: , Rfl:   Allergies  Allergen Reactions  . Aspirin Anaphylaxis and Swelling  . Bee Venom Anaphylaxis  . Ciprofloxacin Shortness Of Breath and Palpitations  . Coconut Oil Anaphylaxis  . Fish Allergy Anaphylaxis  . Niacin And Related Rash    Increased bp  . Oatmeal Anaphylaxis  . Peanut-Containing Drug Products Anaphylaxis  . Penicillins Anaphylaxis  . Shellfish Allergy Anaphylaxis  . Clarithromycin Nausea And Vomiting    rash  . Lac Bovis Rash  . Nitrofurantoin Rash  . Sunscreens Rash   . Calcium-Containing Compounds     Rash body, mouth ulcers  . Keflex [Cephalexin] Hives  . Eggs Or Egg-Derived Products Diarrhea  . Other Diarrhea    Melon - bloating  . Pork Allergy Diarrhea  . Prunus Persica Diarrhea    Peach  . Sulfa Antibiotics Rash    Objective:   BP 125/86   Pulse 95   Ht 5\' 3"  (1.6 m)   Wt 209 lb (94.8 kg)   SpO2 95%   BMI 37.02 kg/m    Physical Exam Vitals reviewed.  Constitutional:      General: She is not in acute distress.    Appearance: Normal appearance. She is morbidly obese. She is not ill-appearing, toxic-appearing or diaphoretic.  HENT:     Head: Normocephalic and atraumatic.     Right Ear: Tympanic membrane, ear canal and external ear normal. There is no impacted cerumen.     Left Ear: Tympanic membrane, ear canal and external ear normal. There is no impacted cerumen.     Nose: Rhinorrhea present. Rhinorrhea is clear.     Right Turbinates: Swollen and pale.     Left Turbinates: Swollen and pale.     Right Sinus: No maxillary sinus tenderness or frontal sinus tenderness.     Left Sinus: No maxillary sinus tenderness or frontal sinus tenderness.     Mouth/Throat:     Mouth: Mucous membranes are moist.     Pharynx: Oropharynx is clear. No oropharyngeal exudate or posterior oropharyngeal erythema.  Eyes:     General: No scleral icterus.       Right eye: No discharge.        Left eye: No discharge.     Conjunctiva/sclera:     Right eye: Right conjunctiva is injected.     Left eye: Left conjunctiva is injected.     Comments: Eye lids red above and below.  Cardiovascular:     Rate and Rhythm: Normal rate.  Pulmonary:     Effort: Pulmonary effort is normal. No respiratory distress.  Musculoskeletal:        General: Normal range of motion.     Cervical back: Normal range of motion.  Lymphadenopathy:     Head:     Right side of head: No submental, submandibular or tonsillar adenopathy.     Left side of head: No submental,  submandibular or tonsillar adenopathy.     Cervical: Cervical adenopathy present.     Right cervical: No superficial, deep or posterior cervical adenopathy.    Left cervical: Superficial cervical adenopathy present. No deep or posterior cervical adenopathy.  Skin:    General: Skin is warm and dry.     Capillary Refill: Capillary refill takes less than 2 seconds.  Neurological:     General: No focal deficit present.     Mental Status: She is  alert and oriented to person, place, and time. Mental status is at baseline.  Psychiatric:        Mood and Affect: Mood normal.        Behavior: Behavior normal.        Thought Content: Thought content normal.        Judgment: Judgment normal.

## 2020-12-19 NOTE — Patient Instructions (Signed)
Allergic Conjunctivitis, Adult  Allergic conjunctivitis is inflammation of the clear membrane (conjunctiva) that covers the white part of your eye and the inner surface of your eyelid. This condition can make your eye red or pink. It can also make your eye feel itchy. This condition cannot be spread from one person to another person (is not contagious). What are the causes? This condition is caused by allergens. These are things that can cause an allergic reaction in some people but not in others. Common allergens include:  Outdoor allergens, such as: ? Pollen, including pollen from grass and weeds. ? Mold. ? Car fumes.  Indoor allergens, such as: ? Dust. ? Smoke. ? Mold. ? Proteins in a pet's pee (urine), saliva, or dander. What increases the risk? You are more likely to develop this condition if you have a family history of these things:  Allergies.  Conditions that you get because of allergens, such as asthma or inflammation of the skin (eczema). What are the signs or symptoms? Symptoms of this condition include eyes that are:  Itchy.  Red.  Watery.  Puffy. Your eyes may also:  Sting or burn.  Have clear fluid draining from them.  Have thick mucus coming from them. How is this treated? This condition may be treated with:  Cold, wet cloths (cold compresses) to soothe itching and swelling.  Washing the face to remove allergens.  Eye drops. These may include: ? Eye drops that block allergies. ? Eye drops that reduce swelling and irritation. ? Steroid eye drops if other treatments have not worked.  Oral antihistamine medicines. These medicines lessen your allergies. You may need these if eye drops do not help or are difficult to use.   Follow these instructions at home: Eye care  Place a cool, clean washcloth on your eye for 10-20 minutes. Do this 3-4 times a day.  Do not touch or rub your eyes.  Do not wear contact lenses until the inflammation is gone.  Wear glasses instead.  Do not wear eye makeup until the inflammation is gone. General instructions  Try not to be around things that you are allergic to.  Take or apply over-the-counter and prescription medicines only as told by your doctor. These include any eye drops.  Drink enough fluid to keep your pee pale yellow.  Keep all follow-up visits as told by your doctor. This is important. Contact a doctor if:  Your symptoms get worse.  Your symptoms do not get better with treatment.  You have mild eye pain.  You are sensitive to light.  You have spots or blisters on your eyes.  You have pus coming from your eye.  You have a fever. Get help right away if:  You have redness, swelling, or other symptoms in only one eye.  You cannot see well.  You have other vision changes.  You have very bad eye pain. Summary  Allergic conjunctivitis is caused by allergens. It can make your eye red or pink, and it can make your eye feel itchy.  This condition cannot be spread from one person to another person (is not contagious).  Try not to be around things that you are allergic to.  Take or apply over-the-counter and prescription medicines only as told by your doctor. These include any eye drops.  Contact your doctor if your symptoms get worse or they do not get better with treatment. This information is not intended to replace advice given to you by your health care provider.   Make sure you discuss any questions you have with your health care provider. Document Revised: 07/10/2019 Document Reviewed: 07/10/2019 Elsevier Patient Education  2021 Elsevier Inc.  

## 2020-12-20 ENCOUNTER — Telehealth: Payer: Self-pay | Admitting: *Deleted

## 2020-12-20 DIAGNOSIS — J302 Other seasonal allergic rhinitis: Secondary | ICD-10-CM

## 2020-12-20 MED ORDER — MOMETASONE FUROATE 50 MCG/ACT NA SUSP: 2.0000 | Freq: Every day | NASAL | 5 refills | Status: AC

## 2020-12-20 NOTE — Telephone Encounter (Signed)
Fax from [facility] pharmacy  RE: Olopatadine HCI 0.2% sol - Insurance prefers Azelastine or Olopatadine, or start a PA  RE: Fluticasone - Insurance prefers Mometasone

## 2020-12-20 NOTE — Telephone Encounter (Signed)
[facility] states that there is only one NDC that is covered and it is on back order.  Patient can get OTC

## 2020-12-20 NOTE — Telephone Encounter (Signed)
Please advise patient to get Pataday eye drops OTC.

## 2020-12-20 NOTE — Telephone Encounter (Signed)
Patient aware and verbalizes understanding. 

## 2020-12-20 NOTE — Addendum Note (Signed)
Addended by: Gwenlyn Fudge on: 12/20/2020 12:08 PM   Modules accepted: Orders

## 2020-12-20 NOTE — Telephone Encounter (Signed)
What is written that is preferred, is what I sent for the eye drops. Is there a different dosage her insurance prefers?  Rx'd mometasone and D/C'd Flonase.

## 2021-01-02 ENCOUNTER — Encounter: Payer: Self-pay | Admitting: Family Medicine

## 2021-01-02 ENCOUNTER — Ambulatory Visit: Payer: 59 | Admitting: Family Medicine

## 2021-02-20 ENCOUNTER — Telehealth: Payer: Self-pay | Admitting: *Deleted

## 2021-02-20 NOTE — Telephone Encounter (Signed)
This is my last note: We are going to try to wean off of this since she has been on it for a very long time.  I have decreased her dosage to 0.3 mg.  She is going to take this once daily x2-week and then decrease to every other day x2 weeks.  Advised if she starts having symptoms of night sweats or her mood worsens to try Estroven, black cohosh, or vitamin D OTC.  So, she should be off the medication by now. Did she try the others? Katie Joyce is another OTC medication for menopausal symptoms.

## 2021-02-20 NOTE — Telephone Encounter (Signed)
Fax from [facility] pharmacy RE: Premarin 0.3 mg 1 QD Note: Therapeutic change - not covered. Estradiol tablets are Please advise

## 2021-02-20 NOTE — Telephone Encounter (Signed)
Lmtcb.

## 2021-02-25 NOTE — Telephone Encounter (Signed)
Lmtcb  No call back - this encounter will be closed  

## 2021-03-25 ENCOUNTER — Encounter: Payer: Self-pay | Admitting: Family Medicine

## 2021-03-26 ENCOUNTER — Encounter: Payer: Self-pay | Admitting: Family

## 2021-03-26 ENCOUNTER — Ambulatory Visit: Payer: 59 | Admitting: Family

## 2021-03-26 ENCOUNTER — Other Ambulatory Visit: Payer: Self-pay

## 2021-03-26 VITALS — BP 148/81 | HR 89 | Temp 97.5°F | Ht 63.0 in | Wt 205.0 lb

## 2021-03-26 DIAGNOSIS — L559 Sunburn, unspecified: Secondary | ICD-10-CM

## 2021-03-26 MED ORDER — DICLOFENAC SODIUM 75 MG PO TBEC: 75.0000 mg | DELAYED_RELEASE_TABLET | Freq: Two times a day (BID) | ORAL | 0 refills | Status: AC

## 2021-03-26 MED ORDER — TRIAMCINOLONE ACETONIDE 0.5 % EX OINT: 1.0000 "application " | TOPICAL_OINTMENT | Freq: Two times a day (BID) | CUTANEOUS | 1 refills | Status: AC

## 2021-03-26 NOTE — Patient Instructions (Signed)
Sunburn, Adult  Sunburn is damage to the skin that is caused by too much exposure to ultraviolet (UV) rays. Repeated, prolonged sun exposure causes signs of early skin aging, such as wrinkles and sun spots. It also increases the risk of skincancer. What are the causes? Sunburn is caused by getting too much UV radiation from the sun, sunlamps, ortanning beds. What increases the risk? The following factors may make you more likely to develop this condition: Having light-colored skin (light complexion), skin with many freckles or moles, or skin that tends to burn instead of tan. Having fair or red hair. Having blue or green eyes. Living in an area with strong sun exposure. Having a family history of sensitivity to the sun or a family history of skin cancer. Having a body defense system (immune system) that does not work properly because of certain diseases (such as lupus) or certain drugs. Taking certain medicines that cause you to be sensitive to sunlight (have photosensitivity). Using certain cosmetics. What are the signs or symptoms? Symptoms of this condition include: Red or pink skin. Soreness and swelling of the skin in the affected areas. Pain. Blisters. Peeling skin. If the sunburn is severe, you may also have a headache, fever, nausea,dizziness, or fatigue. How is this diagnosed? This condition is diagnosed with a medical history and physical exam. How is this treated? Mild or moderate sunburns can often be managed with self-care strategies, including: Cool baths or cool compresses. Moisturizer or aloe for pain relief. Over-the-counter pain relievers. Drinking extra water to replace lost fluids and to prevent dehydration. A severe sunburn may require: Antibiotic medicines if there is an associated infection. IV fluids. Follow these instructions at home: Medicines Take or apply over-the-counter and prescription medicines only as told by your health care provider. If you were  prescribed an antibiotic medicine, use it as told by your health care provider. Do not stop using the antibiotic even if your condition improves. General instructions  Avoid further exposure to the sun. Protect sunburned skin by wearing clothing that covers the injured skin. Do not put ice on your sunburn. This can cause further damage. Try taking a cool bath or applying a cool, wet cloth (cool compress) to your skin. This may help with pain. Drink enough fluid to keep your urine pale yellow. Try applying aloe vera or a moisturizer that has soy in it to your sunburn. This may help. Do not apply aloe vera or moisturizer with soy if your sunburn has blisters. Do not break any blisters that you may have. Keep all follow-up visits as told by your health care provider. This is important.  How is this prevented?  Try to avoid the sun between 10 a.m. and 4 p.m. The sun is strongest during those hours. Apply sunscreen 30 minutes or more before you will be out in the sun. Apply a sunscreen with an SPF of 15 or higher. Consider using an SPF of 30 or higher if you will be exposed to the sun for prolonged periods of time. Use a sunscreen that protects against all of the sun's rays (broad-spectrum) and is water-resistant. Reapply sunscreen: About every 2 hours during sun exposure. More often when sweating a lot while out in the sun. After getting wet from swimming or playing in water. When you are outside, wear long sleeves, a hat, and sunglasses that block UV light. Talk with your health care provider about medicines, herbs, and foods that can make you more sensitive to light. Avoid these,   if possible. Do not use tanning beds. Contact a health care provider if: You have a fever or chills. Your symptoms do not improve with treatment. Your pain is not controlled with medicine. Your burn becomes more painful or swollen. Your sunburn results in open blisters. Get help right away if: You vomit or have  diarrhea. You are dizzy or you pass out. You have a severe headache or you feel confused. You develop severe blistering. You have pus or fluid coming from the blisters. Summary Sunburn is caused by getting too much ultraviolet (UV) radiation from the sun, sunlamps, or tanning beds. People with light-colored skin (light complexion) have an increased risk of sunburn. Mild or moderate sunburns can often be managed with self-care strategies, including cool baths or cool cloths (compresses). To help prevent sunburn, apply sunscreen 30 minutes or more before you will be exposed to the sun. This information is not intended to replace advice given to you by your health care provider. Make sure you discuss any questions you have with your healthcare provider. Document Revised: 06/12/2020 Document Reviewed: 06/13/2020 Elsevier Patient Education  2022 Elsevier Inc.  

## 2021-03-26 NOTE — Progress Notes (Signed)
Subjective:    Patient ID: Katie Joyce, female    DOB: [birth year], 60 y.o.   MRN: [id number]  Chief Complaint  Patient presents with   legs reddness    Was out the pool Thursday. Has had sun poison before feels the same    Pt presents to the office today with complaints of bilateral rash on legs. She reports she was in the sun on Thursday for approx at the pool. She then went back into the shade. Friday afternoon she noticed the rash and burning sensation.   She has has sun poison in the past. She reports a feeling of pins sticking her 4 out 10.  Rash This is a new problem. The current episode started in the past 7 days. The problem has been waxing and waning since onset. The affected locations include the left lower leg and right lower leg. The rash is characterized by burning. Associated with: sun exposure. Treatments tried: aloe, benadryl cream, silvadene cream.     Review of Systems  Skin:  Positive for rash.  All other systems reviewed and are negative.     Objective:   Physical Exam Vitals reviewed.  Constitutional:      General: She is not in acute distress.    Appearance: She is well-developed.  HENT:     Head: Normocephalic and atraumatic.  Eyes:     Pupils: Pupils are equal, round, and reactive to light.  Neck:     Thyroid: No thyromegaly.  Cardiovascular:     Rate and Rhythm: Normal rate and regular rhythm.     Heart sounds: Normal heart sounds. No murmur heard. Pulmonary:     Effort: Pulmonary effort is normal. No respiratory distress.     Breath sounds: Normal breath sounds. No wheezing.  Abdominal:     General: Bowel sounds are normal. There is no distension.     Palpations: Abdomen is soft.     Tenderness: There is no abdominal tenderness.  Musculoskeletal:        General: No tenderness. Normal range of motion.     Cervical back: Normal range of motion and neck supple.  Skin:    General: Skin is warm and dry.     Findings: Rash present.      Comments: Bilateral erythemas rash on medial legs  Neurological:     Mental Status: She is alert and oriented to person, place, and time.     Cranial Nerves: No cranial nerve deficit.     Deep Tendon Reflexes: Reflexes are normal and symmetric.  Psychiatric:        Behavior: Behavior normal.        Thought Content: Thought content normal.        Judgment: Judgment normal.      BP (!) 148/81   Pulse 89   Temp (!) 97.5 F (36.4 C) (Temporal)   Ht 5\' 3"  (1.6 m)   Wt 205 lb (93 kg)   BMI 36.31 kg/m      Assessment & Plan:  BRE PECINA comes in today with chief complaint of legs reddness (Was out the pool Thursday. Has had sun poison before feels the same )   Diagnosis and orders addressed:  1. Sunburn Pt can take ibuprofen, has allergy to aspirin. Will give 4 days of diclofenac BID with food.  Kenalog cream BID Avoid sun exposure  RTO if symptoms worsen or do not improve - triamcinolone ointment (KENALOG) 0.5 %; Apply 1 application  topically 2 (two) times daily.  Dispense: 60 g; Refill: 1 - diclofenac (VOLTAREN) 75 MG EC tablet; Take 1 tablet (75 mg total) by mouth 2 (two) times daily for 4 days.  Dispense: 8 tablet; Refill: 0  Jannifer Rodney, FNP

## 2022-07-26 IMAGING — CT CT RENAL STONE PROTOCOL
2 of 4 series · 17 of 46 positions shown, 19 images · non-contrast
Comparison: CT 07/07/2017

CLINICAL DATA: Flank and pain

EXAM:
CT ABDOMEN AND PELVIS WITHOUT CONTRAST
TECHNIQUE: Multidetector CT imaging of the abdomen and pelvis was performed
following the standard protocol without IV contrast.

[Series 2: axial st · axial · 0.80mm/px · z∈[-341,+49]mm · 14 of 92 slices shown, 16 images]
[im 7/92  soft-tissue]
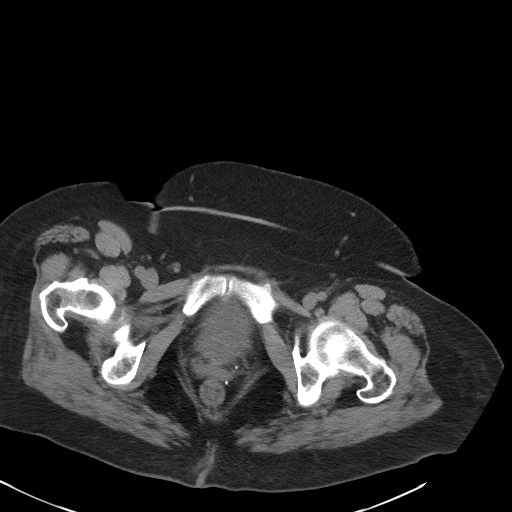
[im 7/92  bone]
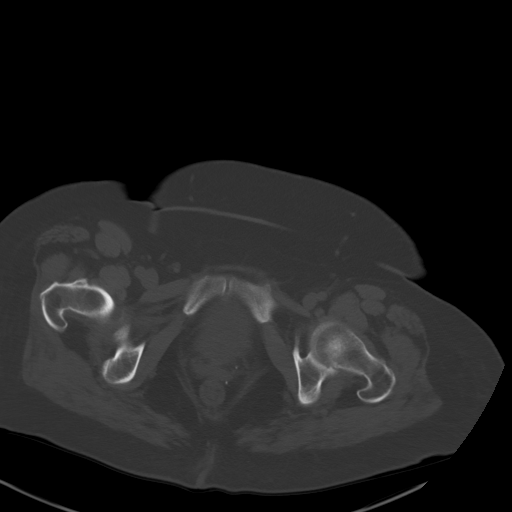
[im 13/92  soft-tissue]
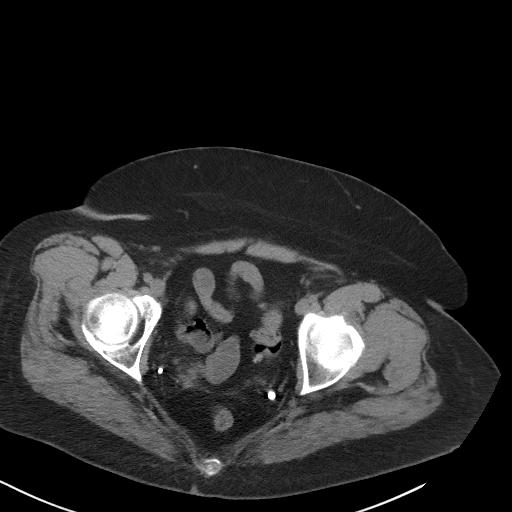
[im 19/92  soft-tissue]
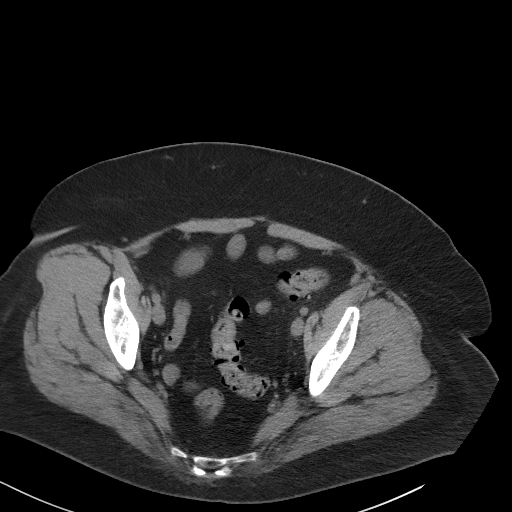
[im 25/92  soft-tissue]
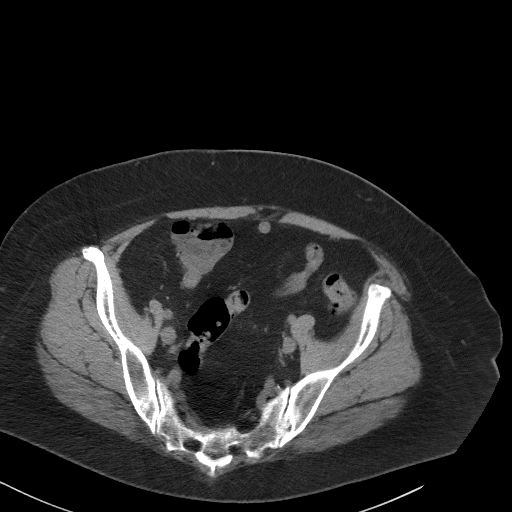
[im 31/92  soft-tissue]
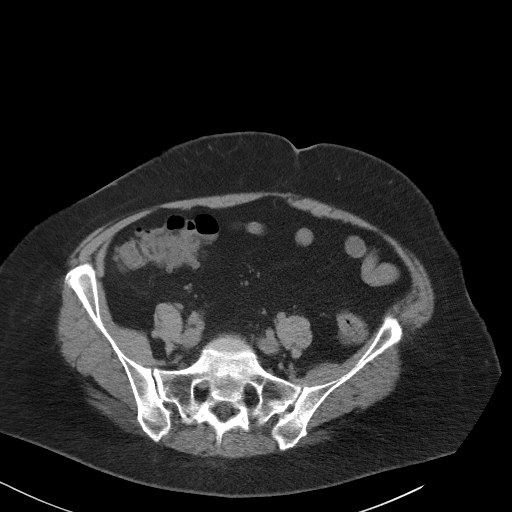
[im 37/92  soft-tissue]
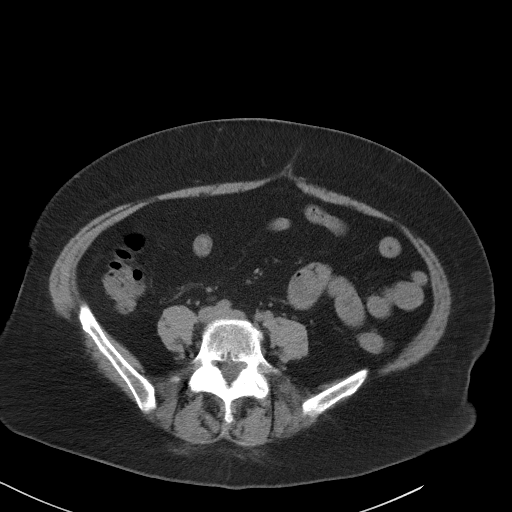
[im 43/92  soft-tissue]
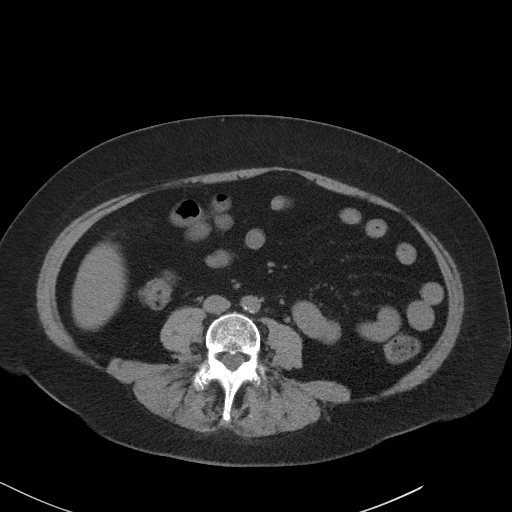
[im 49/92  soft-tissue]
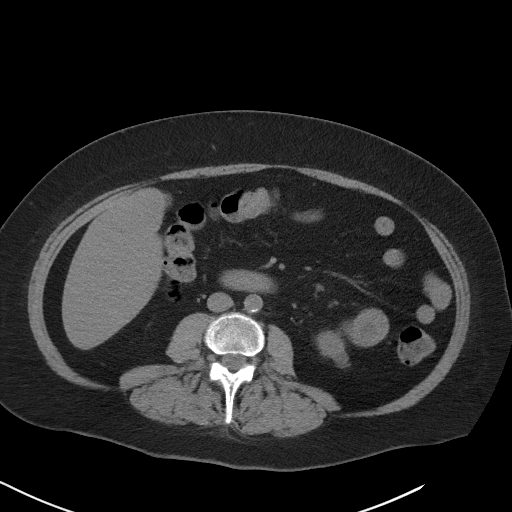
[im 55/92  soft-tissue]
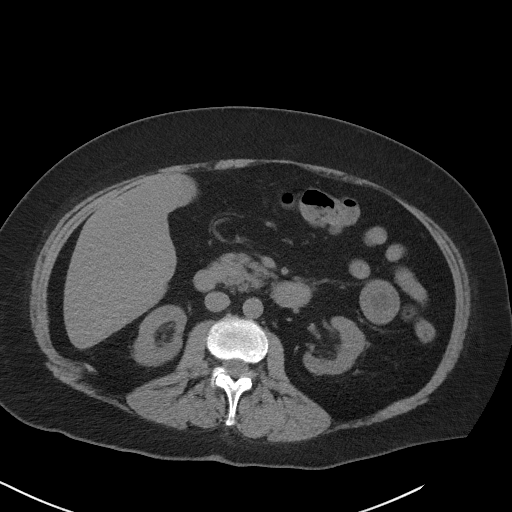
[im 55/92  bone]
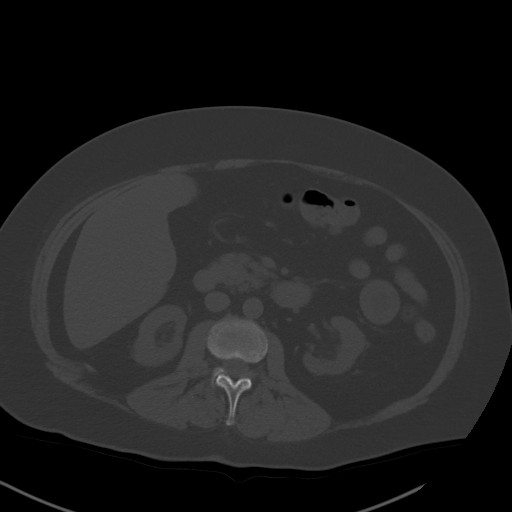
[im 61/92  soft-tissue]
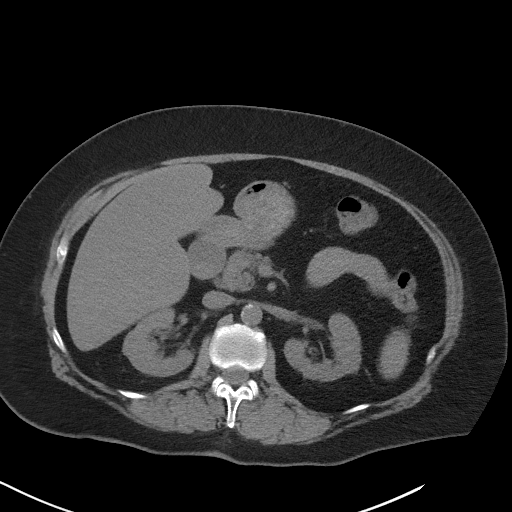
[im 67/92  soft-tissue]
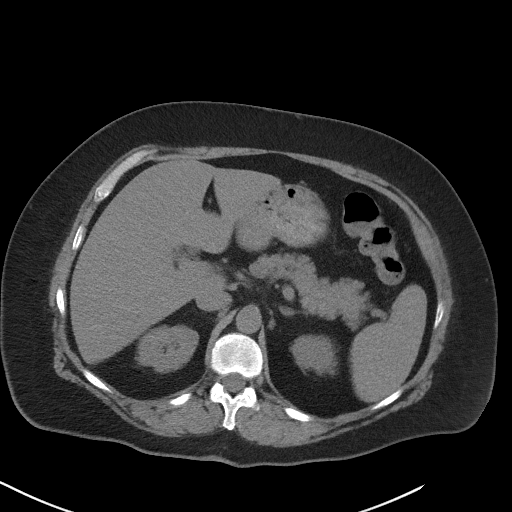
[im 73/92  soft-tissue]
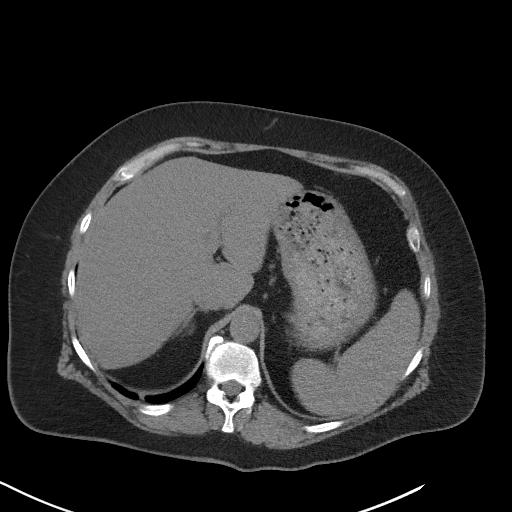
[im 79/92  soft-tissue]
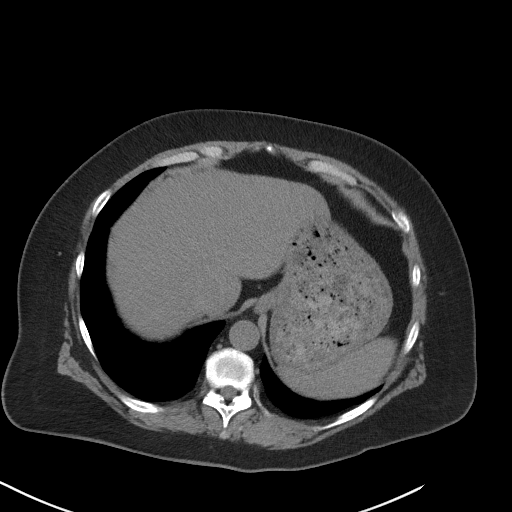
[im 85/92  soft-tissue]
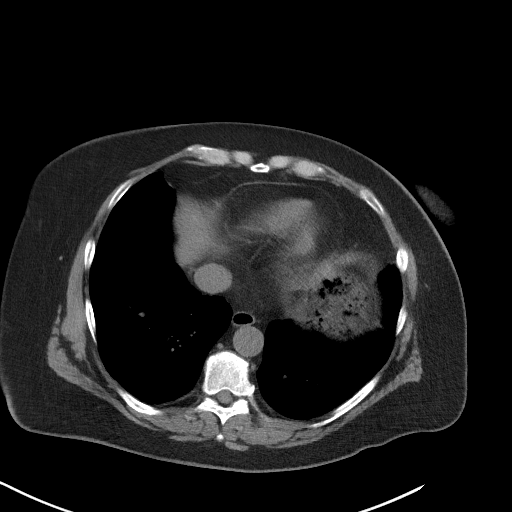

[Series 5: coronal st · coronal · 0.83mm/px · 3 of 101 slices shown]
[im 34/101  soft-tissue]
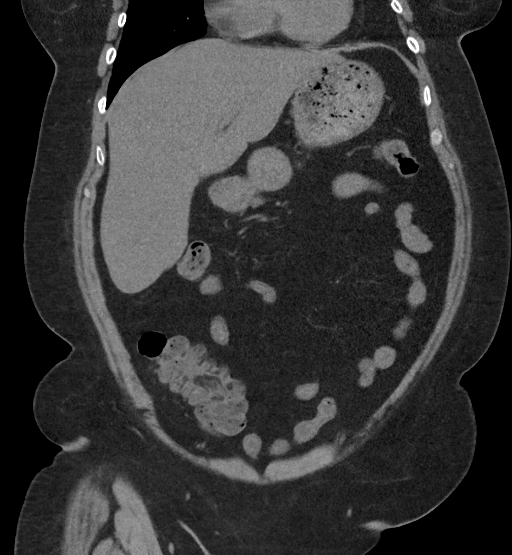
[im 45/101  soft-tissue]
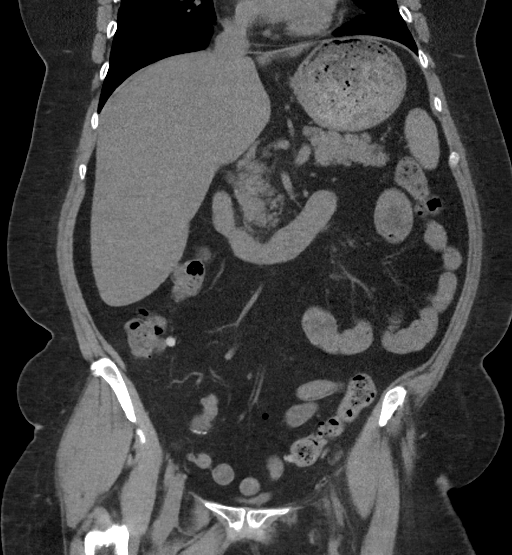
[im 56/101  soft-tissue]
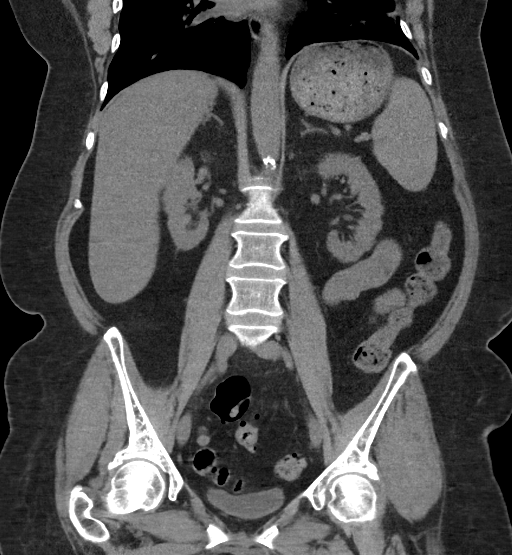

[17 of 46 positions shown; findings below may reference images not displayed]

FINDINGS: Lower chest: Lung bases demonstrate no acute consolidation or
effusion. Atelectasis or scarring at the bases. Borderline
cardiomegaly.

Hepatobiliary: Status post cholecystectomy. No focal hepatic
abnormality or biliary dilatation

Pancreas: Unremarkable. No pancreatic ductal dilatation or
surrounding inflammatory changes.

Spleen: Normal in size without focal abnormality.

Adrenals/Urinary Tract: Adrenal glands are normal. Exophytic cyst
lower pole left kidney. No hydronephrosis. No ureteral stone.
Bladder is unremarkable

Stomach/Bowel: Stomach is within normal limits. Status post
appendectomy. Descending and sigmoid colon diverticular disease
without acute inflammatory change. No evidence of bowel wall
thickening, distention, or inflammatory changes.

Vascular/Lymphatic: Mild aortic atherosclerosis. No aneurysm. No
suspicious nodes.

Reproductive: Status post hysterectomy. No adnexal masses.

Other: Negative for free air or free fluid.

Musculoskeletal: No acute or significant osseous findings.
IMPRESSION: 1. Negative for hydronephrosis or ureteral stone.
2. Descending and sigmoid colon diverticular disease without acute
inflammatory change.

Aortic Atherosclerosis ([id number]).

## 2022-11-06 ENCOUNTER — Encounter: Payer: Self-pay | Admitting: Family Medicine
